# Patient Record
Sex: Female | Born: 1970 | Race: Black or African American | Hispanic: No | Marital: Married | Smoking: Never smoker
Health system: Southern US, Community
[De-identification: ages and names within clinical notes are randomized; demographics above are authoritative.]

## PROBLEM LIST (undated history)

## (undated) ENCOUNTER — Emergency Department (HOSPITAL_COMMUNITY): Admission: EM | Payer: 59 | Source: Home / Self Care

## (undated) DIAGNOSIS — E039 Hypothyroidism, unspecified: Secondary | ICD-10-CM

## (undated) DIAGNOSIS — Z91018 Allergy to other foods: Secondary | ICD-10-CM

## (undated) DIAGNOSIS — T7840XA Allergy, unspecified, initial encounter: Secondary | ICD-10-CM

## (undated) DIAGNOSIS — J309 Allergic rhinitis, unspecified: Secondary | ICD-10-CM

## (undated) DIAGNOSIS — E669 Obesity, unspecified: Secondary | ICD-10-CM

## (undated) DIAGNOSIS — I10 Essential (primary) hypertension: Secondary | ICD-10-CM

## (undated) HISTORY — DX: Allergy, unspecified, initial encounter: T78.40XA

## (undated) HISTORY — DX: Allergic rhinitis, unspecified: J30.9

## (undated) HISTORY — DX: Obesity, unspecified: E66.9

## (undated) HISTORY — PX: TUBAL LIGATION: SHX77

## (undated) HISTORY — DX: Hypothyroidism, unspecified: E03.9

## (undated) HISTORY — DX: Essential (primary) hypertension: I10

---

## 1998-02-15 ENCOUNTER — Inpatient Hospital Stay (HOSPITAL_COMMUNITY): Admission: AD | Admit: 1998-02-15 | Discharge: 1998-02-15 | Payer: Self-pay | Admitting: Obstetrics and Gynecology

## 1998-02-17 ENCOUNTER — Emergency Department (HOSPITAL_COMMUNITY): Admission: EM | Admit: 1998-02-17 | Discharge: 1998-02-17 | Payer: Self-pay | Admitting: Emergency Medicine

## 1998-03-17 ENCOUNTER — Other Ambulatory Visit: Admission: RE | Admit: 1998-03-17 | Discharge: 1998-03-17 | Payer: Self-pay | Admitting: Obstetrics and Gynecology

## 1998-07-03 ENCOUNTER — Emergency Department (HOSPITAL_COMMUNITY): Admission: EM | Admit: 1998-07-03 | Discharge: 1998-07-03 | Payer: Self-pay | Admitting: Emergency Medicine

## 1999-04-06 ENCOUNTER — Other Ambulatory Visit: Admission: RE | Admit: 1999-04-06 | Discharge: 1999-04-06 | Payer: Self-pay | Admitting: *Deleted

## 1999-07-10 ENCOUNTER — Encounter: Admission: RE | Admit: 1999-07-10 | Discharge: 1999-07-10 | Payer: Self-pay | Admitting: Internal Medicine

## 1999-07-10 ENCOUNTER — Encounter: Payer: Self-pay | Admitting: Internal Medicine

## 2000-07-20 ENCOUNTER — Other Ambulatory Visit: Admission: RE | Admit: 2000-07-20 | Discharge: 2000-07-20 | Payer: Self-pay | Admitting: *Deleted

## 2000-09-06 ENCOUNTER — Emergency Department (HOSPITAL_COMMUNITY): Admission: EM | Admit: 2000-09-06 | Discharge: 2000-09-07 | Payer: Self-pay | Admitting: Emergency Medicine

## 2001-04-05 ENCOUNTER — Other Ambulatory Visit: Admission: RE | Admit: 2001-04-05 | Discharge: 2001-04-05 | Payer: Self-pay | Admitting: Obstetrics & Gynecology

## 2001-04-13 ENCOUNTER — Inpatient Hospital Stay (HOSPITAL_COMMUNITY): Admission: AD | Admit: 2001-04-13 | Discharge: 2001-04-13 | Payer: Self-pay | Admitting: Obstetrics and Gynecology

## 2001-09-08 ENCOUNTER — Encounter: Admission: RE | Admit: 2001-09-08 | Discharge: 2001-09-08 | Payer: Self-pay | Admitting: Obstetrics & Gynecology

## 2001-11-11 ENCOUNTER — Inpatient Hospital Stay (HOSPITAL_COMMUNITY): Admission: AD | Admit: 2001-11-11 | Discharge: 2001-11-14 | Payer: Self-pay | Admitting: Obstetrics & Gynecology

## 2001-12-18 ENCOUNTER — Other Ambulatory Visit: Admission: RE | Admit: 2001-12-18 | Discharge: 2001-12-18 | Payer: Self-pay | Admitting: Obstetrics & Gynecology

## 2003-03-05 ENCOUNTER — Other Ambulatory Visit: Admission: RE | Admit: 2003-03-05 | Discharge: 2003-03-05 | Payer: Self-pay | Admitting: Obstetrics & Gynecology

## 2004-01-10 ENCOUNTER — Encounter: Admission: RE | Admit: 2004-01-10 | Discharge: 2004-01-10 | Payer: Self-pay | Admitting: Internal Medicine

## 2005-08-20 ENCOUNTER — Ambulatory Visit (HOSPITAL_COMMUNITY): Admission: RE | Admit: 2005-08-20 | Discharge: 2005-08-20 | Payer: Self-pay | Admitting: Cardiology

## 2007-01-26 ENCOUNTER — Encounter: Admission: RE | Admit: 2007-01-26 | Discharge: 2007-01-26 | Payer: Self-pay | Admitting: Obstetrics & Gynecology

## 2007-04-10 ENCOUNTER — Inpatient Hospital Stay (HOSPITAL_COMMUNITY): Admission: AD | Admit: 2007-04-10 | Discharge: 2007-04-12 | Payer: Self-pay | Admitting: Obstetrics & Gynecology

## 2007-04-10 ENCOUNTER — Encounter (INDEPENDENT_AMBULATORY_CARE_PROVIDER_SITE_OTHER): Payer: Self-pay | Admitting: *Deleted

## 2007-05-09 ENCOUNTER — Ambulatory Visit (HOSPITAL_COMMUNITY): Admission: RE | Admit: 2007-05-09 | Discharge: 2007-05-09 | Payer: Self-pay | Admitting: Obstetrics & Gynecology

## 2008-03-12 ENCOUNTER — Emergency Department (HOSPITAL_COMMUNITY): Admission: EM | Admit: 2008-03-12 | Discharge: 2008-03-12 | Payer: Self-pay | Admitting: Emergency Medicine

## 2008-11-14 ENCOUNTER — Emergency Department (HOSPITAL_COMMUNITY): Admission: EM | Admit: 2008-11-14 | Discharge: 2008-11-14 | Payer: Self-pay | Admitting: Emergency Medicine

## 2010-02-18 ENCOUNTER — Emergency Department (HOSPITAL_COMMUNITY): Admission: EM | Admit: 2010-02-18 | Discharge: 2010-02-18 | Payer: Self-pay | Admitting: Emergency Medicine

## 2010-04-08 ENCOUNTER — Emergency Department (HOSPITAL_COMMUNITY): Admission: EM | Admit: 2010-04-08 | Discharge: 2010-04-08 | Payer: Self-pay | Admitting: Emergency Medicine

## 2010-09-05 ENCOUNTER — Emergency Department (HOSPITAL_COMMUNITY)
Admission: EM | Admit: 2010-09-05 | Discharge: 2010-09-05 | Payer: Self-pay | Source: Home / Self Care | Admitting: Family Medicine

## 2010-11-22 ENCOUNTER — Inpatient Hospital Stay (INDEPENDENT_AMBULATORY_CARE_PROVIDER_SITE_OTHER)
Admit: 2010-11-22 | Discharge: 2010-11-22 | Disposition: A | Discharge status: Home / Self Care | Payer: 59 | Attending: Family Medicine | Admitting: Family Medicine

## 2010-11-22 DIAGNOSIS — T783XXA Angioneurotic edema, initial encounter: Secondary | ICD-10-CM

## 2011-01-12 NOTE — Op Note (Signed)
NAMERONNE, SAVOIA NO.:  [id number]   MEDICAL RECORD NO.:  [id number]          PATIENT TYPE:  AMB   LOCATION:  SDC                           FACILITY:  [facility]   PHYSICIAN:  Genia Del, M.D.DATE OF BIRTH:  [birth year]   DATE OF PROCEDURE:  05/09/2007  DATE OF DISCHARGE:                               OPERATIVE REPORT   PREOPERATIVE DIAGNOSIS:  Desire for bilateral tubal sterilization.   POSTOPERATIVE DIAGNOSIS:  Desire for bilateral tubal sterilization.   PROCEDURE:  Laparoscopy, bilateral tubal sterilization with  cauterization.   SURGEON:  Genia Del, M.D.   ASSISTANT:  None.   ANESTHESIOLOGIST:  Belva Agee, M.D.   DESCRIPTION OF PROCEDURE:  Under general anesthesia with endotracheal  intubation, the patient is in the lithotomy position.  She is prepped  with Betadine on the abdominal, suprapubic, vulvar and vaginal areas and  draped as usual.  The vaginal exam revealed an anteverted uterus, normal  volume, mobile, no adnexal mass.  The speculum is inserted in the  vagina.  The uterus is cannulated. The speculum is removed.  We go  abdominally.  We infiltrate the subcutaneous tissue with Marcaine 0.25%  plain.  We then make a 1.5 cm incision with a scalpel infraumbilically.  We opened the aponeurosis under direct vision with Mayo scissors.  We  then opened the parietal peritoneum under direct vision with Mayo  scissors.  We do a pursestring stitch of 0 Vicryl at the aponeurosis.  We insert the [place] with the camera at that level.  We visualized the  intra-abdominal and pelvic cavities.  No lesion is seen except for an  adhesion between the anterior wall of the abdomen and the omentum on the  midline.  We can go around that to proceed with the tubal.   We used the operative scope. The bipolar clamp is used to cauterize the  right tube at about 2 cm from the cornua. We cauterize the full width of  the tube including the mesosalpinx. We  then cauterize slightly distally  and complete cauterization inbetween the tube.  We proceed exactly the  same way on the left side.  We take pictures before and after  cauterization of both tubes. The tubes were well identified with the  fimbriae at the distal end. Hemostasis is adequate.  We, therefore,  remove all instruments, evacuate the CO2.  We close the infraumbilical  incision by attaching the pursestring stitch on the aponeurosis.  We  then close the skin with a subcuticular stitch of 4-0 Vicryl and put  Dermabond on top. The instrument is removed vaginally.  The estimated  blood loss was minimal.  No complications occurred.  The patient was  brought to the recovery room in good stable status.      Genia Del, M.D.  Electronically Signed    ML/MEDQ  D:  05/09/2007  T:  05/09/2007  Job:  [id number]

## 2011-01-15 NOTE — Op Note (Signed)
[facility]  Patient:    Victoria Sampson, Victoria Sampson Visit Number: [id number] MRN: [id number]          Service Type: OBS Location: 910A 9113 01 Attending Physician:  Maxie Better Dictated by:   Sheria Lang. Cherly Hensen, M.D. Proc. Date: 11/11/01 Admit Date:  11/11/2001                             Operative Report  PREOPERATIVE DIAGNOSES:       1. Fetal intolerance to labor.                               2. Term gestation.                               3. Class A1 gestational diabetes.  POSTOPERATIVE DIAGNOSES:      1. Fetal intolerance to labor.                               2. Term gestation.                               3. Class A1 gestational diabetes.  PROCEDURE:                    Primary cesarean section per hysterotomy.  SURGEON:                      Sheronette A. Cherly Hensen, M.D.  ANESTHESIA:                   Epidural.  INDICATIONS:                  The patient is a 40 year old gravida 3, para 0-0-2-0 female at 39+ weeks gestation with well controlled gestational diabetes who presented with spontaneous rupture of membranes in early labor. on initial presentation, the patient had some deep variable decelerations. Group B strep culture was positive.  She was transferred to labor and delivery, where she had internal scalp electrode placement and an intrauterine pressure catheter.  Amnio infusion was started in order to try to alleviate the variable deceleration that were noted with each contraction.  Her contraction pattern was every 4-6 minutes.  Her cervix progressed to 5-6 cm, 100% and +1 station.  The patient received an epidural and continued to have suboptimal contractile pattern as well as variable decelerations.  The beat-to-beat variability was otherwise good.  Penicillin prophylaxis was given.  Pitocin 1 miu was subsequently started in order to try to facilitate labor.  However, recurrent deep variable decelerations would negate  the ability to continue to use Pitocin, which was necessary for the labor to further progress.  Based on these findings, the patient was informed of the need for a cesarean section.  The risks and benefits of the procedure were explained to the patient and consent was signed.  The patient was transferred to the operating room.  DESCRIPTION OF PROCEDURE:     Under adequate epidural anesthesia, the patient was placed in the supine position with a left lateral tilt.  She had an indwelling Foley catheter in place.  She was sterilely prepped and draped in the usual fashion.  Approximately 8 cc of 0.25% Marcaine was injected along the planned Pfannenstiel skin incision.  A Pfannenstiel skin incision was then made and carried down sharply to the rectus fascia.  The rectus fascia was incised in the midline and extended bilaterally.  The rectus fascia was then bluntly and sharply dissected off the rectus muscles in superior and inferior fashion.  The rectus muscle was split in the midline.  The parietal peritoneum was entered bluntly and extended superiorly and inferiorly.  The vesicouterine peritoneum was then opened.  A curvilinear low transverse uterine incision was then made and extended bluntly with bandage scissors.  A loop of cord was noted.  The baby was delivered from the left occiput transverse presentation. The cord was taken from around the neck.  In attempting to deliver the shoulder, subsequent need for a 180 rotation of the baby was done in order to facilitate delivery of the shoulder, with subsequent delivery of a live female. The cord was clamped and cut.  The baby was subsequently transferred to the awaiting pediatricians, who assigned Apgars of 8 and 9 at one and five minutes.  The weight of the baby was 7 lb even.  Cord pH was obtained, which was 7.21.  Cord bloods were obtained.  The placenta was spontaneous and intact.  The uterine cavity was then cleaned of debris.  No  extension was noted on the uterine incision.  The uterine incision was closed in two layers. The first layer was a 0 Monocryl running lock stitch.  The second layer was imbricating with 0 Monocryl.  A small bleeder in the midportion of the incision resulted in a figure-of-eight placement with good hemostasis subsequently noted.  Normal tubes and ovaries were noted bilaterally.  The abdomen was irrigated and suctioned of debris.   The pericolic gutters were cleaned of debris.  Reinspection of the incision showed some bleeding along the peritoneal edge of the bladder, which was then cauterized with good hemostasis.  The vesicouterine peritoneum was not closed, nor was the parietal peritoneum.  The surface of the rectus fascia was inspected and all bleeders cauterized.  The rectus fascia was closed with 0 Vicryl x 2.  The subcutaneous area was irrigated, small bleeders cauterized.  The skin was approximated using Ethicon staples.  Specimens of placenta were not sent to pathology. Estimated blood loss was 500 cc.  Intraoperative fluid was 1700 cc crystalloid.  Urine output was 275 cc of clear yellow urine.  Sponge and instrument counts x2 were correct.  There were no complications.  The patient tolerated the procedure well and was transferred to the recovery room in stable condition. Dictated by:   Sheria Lang. Cherly Hensen, M.D. Attending Physician:  Maxie Better DD:  11/11/01 TD:  11/13/01 Job: [id number]

## 2011-01-15 NOTE — Discharge Summary (Signed)
[facility]  Patient:    Victoria Sampson, Victoria Sampson Visit Number: [id number] MRN: [id number]          Service Type: OBS Location: 910A 9113 01 Attending Physician:  Maxie Better Dictated by:   Sheria Lang. Cherly Hensen, M.D. Admit Date:  11/11/2001 Discharge Date: 11/14/2001                             Discharge Summary  ADMISSION DIAGNOSES:          1. Spontaneous rupture of membranes, term                                  gestation.                               2. Class A1 gestational diabetes.  DISCHARGE DIAGNOSES:          1. Term gestation delivered.                               2. Class A1 gestational diabetes.                               3. Fetal intolerance to labor status post                                  primary cesarean section.                               4. Postoperative anemia.  PROCEDURES:                   Primary cesarean section.  HISTORY OF PRESENT ILLNESS:   A 40 year old gravida 3, para 0 female at 39-2/7 weeks with class A1 gestational diabetes presented with spontaneous rupture of membranes.  On presentation, the patient was 3 to 4 cm, 100%, -1, fern positive.  HOSPITAL COURSE:              The patient had a reactive tracing with some variable deceleration in maternity admissions.  She was contracting every 4 to 5 minutes.  Her group B strep culture was positive.  The patient was started on penicillin prophylaxis.  Internal scalp electrode and intrauterine pressure catheter were placed.  The patient subsequently had epidural, and the incision was done for the variable decelerations.  The variable decelerations persistent.  The patient was noted to have suboptimal contractions, frequency, and strength that would necessitate Pitocin augmentation, although given the persistent variable decelerations, she was not a candidate for Pitocin. Maternal oxygenation was given.  The patient on maternal lateral side. Tracing was  observed with improvement.  Pitocin was started in 1 milliunits. This was discontinued secondary to continued deceleration.  At that time, the patient was 7 cm, 100%, +1, right occiput transverse position, contracting about every 2 to 3 minutes, good beat-to-beat variability.  Subcutaneous terbutaline x 1 was given.  Given the fetal intolerance to labor, decision was made to proceed with a primary cesarean section.  The patient was taken to the operating room.  Findings were a baby  who was alive, female, Apgars 8/9, cord pH 7.21, cord around neck x 1.  Normal tubes and ovaries were identified.  Postoperatively, the patient had a CBC showing hemoglobin 10.8, hematocrit 32.5, white count 10.9.  She was continued on antibiotics until afebrile for 24 hours.  Her postoperative course was notable for slightly increased blood pressure; however, no suggestion of superimposed preeclampsia.  The patient has a history of hypertension without any medications.  By postop day #3, the patient was voiding well, tolerating a regular diet, without any headache, visual changes, or right upper quadrant pain.  Her incision was without any erythema, induration, or exudate.  She had 1+ pitting edema.  She was deemed well to be discharged home.  DISPOSITION:                  Home.  CONDITION UPON DISCHARGE:     Stable.  DISCHARGE MEDICATIONS:        1. Tylox 1 p.o. q.6-8h. p.r.n. pain, #20.                               2. Motrin 600 mg 1 p.o. q.6h. p.r.n. pain.                               3. Prenatal vitamins 1 p.o. q.d.                               4. Ferrous sulfate 1 p.o. q.d.  DISCHARGE INSTRUCTIONS:       PIH instructions were given.  Call for temperature greater or equal to 100.4.  Nothing per vagina for four to six weeks.  No heavy lifting or driving for two weeks.  Call if increased incisional pain, redness, or drainage at incision site, soaking a regular pad every hour or more frequently,  severe abdominal pain, nausea, vomiting.  FOLLOWUP:                     [facility] in four to six weeks. ictated by:   Sheronette A. Cherly Hensen, M.D. Attending Physician:  Maxie Better DD:  11/23/01 TD:  11/24/01 Job: [id number]

## 2011-05-03 ENCOUNTER — Emergency Department (HOSPITAL_COMMUNITY)
Admission: EM | Admit: 2011-05-03 | Discharge: 2011-05-03 | Disposition: A | Discharge status: Home / Self Care | Payer: 59 | Attending: Emergency Medicine | Admitting: Emergency Medicine

## 2011-05-03 DIAGNOSIS — R110 Nausea: Secondary | ICD-10-CM | POA: Insufficient documentation

## 2011-05-03 DIAGNOSIS — X58XXXA Exposure to other specified factors, initial encounter: Secondary | ICD-10-CM | POA: Insufficient documentation

## 2011-05-03 DIAGNOSIS — T783XXA Angioneurotic edema, initial encounter: Secondary | ICD-10-CM | POA: Insufficient documentation

## 2011-05-03 DIAGNOSIS — I10 Essential (primary) hypertension: Secondary | ICD-10-CM | POA: Insufficient documentation

## 2011-05-03 DIAGNOSIS — Z9109 Other allergy status, other than to drugs and biological substances: Secondary | ICD-10-CM | POA: Insufficient documentation

## 2011-05-03 DIAGNOSIS — M25579 Pain in unspecified ankle and joints of unspecified foot: Secondary | ICD-10-CM | POA: Insufficient documentation

## 2011-05-03 DIAGNOSIS — E039 Hypothyroidism, unspecified: Secondary | ICD-10-CM | POA: Insufficient documentation

## 2011-05-27 LAB — D-DIMER, QUANTITATIVE: D-Dimer, Quant: <0.22

## 2011-05-27 LAB — POCT I-STAT, CHEM 8
BUN: 8
Creatinine, Ser: 0.9
Hemoglobin: 15
Potassium: 3.4 — ABNORMAL LOW
Sodium: 139

## 2011-05-27 LAB — POCT CARDIAC MARKERS
CKMB, poc: 1.3
Myoglobin, poc: 54.1
Operator id: 231701

## 2011-06-11 LAB — CBC
MCHC: 33.8
Platelets: 248
RBC: 4.69
WBC: 4.6

## 2011-06-11 LAB — BASIC METABOLIC PANEL
Chloride: 101
Creatinine, Ser: 0.67
GFR calc Af Amer: >60
GFR calc non Af Amer: >60
Potassium: 3.2 — ABNORMAL LOW

## 2011-06-11 LAB — PREGNANCY, URINE: Preg Test, Ur: NEGATIVE

## 2011-06-14 LAB — CBC
HCT: 32.1 — ABNORMAL LOW
HCT: 43.3
Hemoglobin: 11 — ABNORMAL LOW
MCHC: 34.2
MCV: 87.7
MCV: 87.9
Platelets: 168
RBC: 4.94
RDW: 14.6 — ABNORMAL HIGH
WBC: 7.3

## 2011-06-14 LAB — URIC ACID: Uric Acid, Serum: 4.7

## 2011-06-14 LAB — COMPREHENSIVE METABOLIC PANEL
ALT: 15
AST: 31
Albumin: 3 — ABNORMAL LOW
Alkaline Phosphatase: 152 — ABNORMAL HIGH
BUN: 6
Chloride: 103
Potassium: 4.6
Sodium: 133 — ABNORMAL LOW
Total Bilirubin: 0.9

## 2011-06-14 LAB — RPR: RPR Ser Ql: NONREACTIVE

## 2011-10-15 ENCOUNTER — Other Ambulatory Visit: Payer: Self-pay | Admitting: Internal Medicine

## 2011-10-15 DIAGNOSIS — R748 Abnormal levels of other serum enzymes: Secondary | ICD-10-CM

## 2011-10-15 DIAGNOSIS — R1012 Left upper quadrant pain: Secondary | ICD-10-CM

## 2011-10-21 ENCOUNTER — Ambulatory Visit
Admission: RE | Admit: 2011-10-21 | Discharge: 2011-10-21 | Disposition: A | Discharge status: Home / Self Care | Payer: 59 | Source: Ambulatory Visit | Attending: Internal Medicine | Admitting: Internal Medicine

## 2011-10-21 ENCOUNTER — Other Ambulatory Visit: Payer: Self-pay | Admitting: Internal Medicine

## 2011-10-21 DIAGNOSIS — M545 Low back pain: Secondary | ICD-10-CM

## 2011-10-21 DIAGNOSIS — R1012 Left upper quadrant pain: Secondary | ICD-10-CM

## 2011-10-21 DIAGNOSIS — R748 Abnormal levels of other serum enzymes: Secondary | ICD-10-CM

## 2011-12-16 ENCOUNTER — Other Ambulatory Visit: Payer: Self-pay | Admitting: Obstetrics & Gynecology

## 2011-12-16 DIAGNOSIS — Z1231 Encounter for screening mammogram for malignant neoplasm of breast: Secondary | ICD-10-CM

## 2011-12-27 ENCOUNTER — Ambulatory Visit
Admission: RE | Admit: 2011-12-27 | Discharge: 2011-12-27 | Disposition: A | Discharge status: Home / Self Care | Payer: 59 | Source: Ambulatory Visit | Attending: Obstetrics & Gynecology | Admitting: Obstetrics & Gynecology

## 2011-12-27 DIAGNOSIS — Z1231 Encounter for screening mammogram for malignant neoplasm of breast: Secondary | ICD-10-CM

## 2012-05-02 ENCOUNTER — Other Ambulatory Visit: Payer: Self-pay | Admitting: Internal Medicine

## 2012-05-02 DIAGNOSIS — R1012 Left upper quadrant pain: Secondary | ICD-10-CM

## 2012-05-05 ENCOUNTER — Ambulatory Visit
Admission: RE | Admit: 2012-05-05 | Discharge: 2012-05-05 | Disposition: A | Discharge status: Home / Self Care | Payer: 59 | Source: Ambulatory Visit | Attending: Internal Medicine | Admitting: Internal Medicine

## 2012-05-05 DIAGNOSIS — R1012 Left upper quadrant pain: Secondary | ICD-10-CM

## 2012-05-05 MED ORDER — IOHEXOL 300 MG/ML  SOLN: 125.0000 mL | Freq: Once | INTRAMUSCULAR | Status: AC | PRN

## 2012-09-19 ENCOUNTER — Encounter: Payer: Self-pay | Admitting: Hematology

## 2012-12-20 ENCOUNTER — Emergency Department (HOSPITAL_COMMUNITY)
Admission: EM | Admit: 2012-12-20 | Discharge: 2012-12-20 | Disposition: A | Discharge status: Home / Self Care | Payer: 59 | Source: Home / Self Care | Attending: Family Medicine | Admitting: Family Medicine

## 2012-12-20 ENCOUNTER — Encounter (HOSPITAL_COMMUNITY): Payer: Self-pay | Admitting: Emergency Medicine

## 2012-12-20 DIAGNOSIS — T783XXA Angioneurotic edema, initial encounter: Secondary | ICD-10-CM

## 2012-12-20 HISTORY — DX: Allergy to other foods: Z91.018

## 2012-12-20 MED ORDER — PREDNISONE 20 MG PO TABS: ORAL_TABLET | ORAL | Status: DC

## 2012-12-20 MED ORDER — METHYLPREDNISOLONE SODIUM SUCC 125 MG IJ SOLR
INTRAMUSCULAR | Status: AC
  Filled 2012-12-20: qty 2

## 2012-12-20 MED ORDER — METHYLPREDNISOLONE SODIUM SUCC 125 MG IJ SOLR
125.0000 mg | Freq: Once | INTRAMUSCULAR | Status: AC
  Administered 2012-12-20: 125 mg via INTRAMUSCULAR

## 2012-12-20 NOTE — ED Notes (Addendum)
Pt c/o allergic reaction possibly to chocolate she had yesterday. 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 Right side of face is swollen and inside mouth. Is itching and a little painful. Has a slight headache. No fever or SOB. Took Zyrtec and Famotidine this morning with mild relief. Patient is alert and oriented.

## 2012-12-20 NOTE — ED Provider Notes (Signed)
History     CSN: [id number]  Arrival date & time 12/20/12  1019   First MD Initiated Contact with Patient 12/20/12 1023      Chief Complaint  Patient presents with  . Allergic Reaction    (Consider location/radiation/quality/duration/timing/severity/associated sxs/prior treatment) HPI Comments: 42 year old female with history of  Allergy to nuts here complaining of right-sided upper lip swelling that she first noticed at wake up this morning. Patient states that she ate chocolate last night that may have contained nuts. She denies throat discomfort or difficulty breathing. No wheezing. No shortness of breath. Denies nausea or vomiting. No abdominal pain. Denies headache or dizziness. No rash. No fever or chills. No other known exposures. No taking ACE inhibitors.    Past Medical History  Diagnosis Date  . Hypertension   . Hypothyroidism   . Obesity   . Allergic rhinitis   . Food allergy     Past Surgical History  Procedure Laterality Date  . Tubal ligation      History reviewed. No pertinent family history.  History  Substance Use Topics  . Smoking status: Never Smoker   . Smokeless tobacco: Not on file  . Alcohol Use: No    OB History   Grav Para Term Preterm Abortions TAB SAB Ect Mult Living                  Review of Systems  Constitutional: Negative for fever and chills.  HENT: Negative for sore throat, trouble swallowing and voice change.        As per HPI  Respiratory: Negative for cough, chest tightness, shortness of breath and wheezing.   Cardiovascular: Negative for chest pain.  Gastrointestinal: Negative for nausea, vomiting, abdominal pain and diarrhea.  Genitourinary: Negative for dysuria and genital sores.  Skin: Positive for rash.  Neurological: Negative for dizziness and headaches.  All other systems reviewed and are negative.    Allergies  Asa; Macrobid; and Tylenol  Home Medications   Current Outpatient Rx  Name  Route  Sig  Dispense   Refill  . amLODipine (NORVASC) 5 MG tablet   Oral   Take 5 mg by mouth daily.         Marland Kitchen levocetirizine (XYZAL) 5 MG tablet   Oral   Take 5 mg by mouth every evening.         . predniSONE (DELTASONE) 20 MG tablet      2 tabs by mouth daily for 5 days.   10 tablet   0     BP 144/90  Pulse 71  Temp(Src) 98.4 F (36.9 C) (Oral)  Resp 18  SpO2 100%  LMP 12/14/2012  Physical Exam  Nursing note and vitals reviewed. Constitutional: She is oriented to person, place, and time. She appears well-developed and well-nourished. No distress.  HENT:  Head: Normocephalic and atraumatic.  Right Ear: External ear normal.  Left Ear: External ear normal.  Mouth/Throat: Oropharynx is clear and moist. No oropharyngeal exudate.  There is a uniformly firm edema of the right side upper lip. No erythema associated. No skin brakes or areas of induration or fluctuation.   Eyes: Conjunctivae and EOM are normal. Pupils are equal, round, and reactive to light. Right eye exhibits no discharge. Left eye exhibits no discharge. No scleral icterus.  Neck: Neck supple. No thyromegaly present.  Cardiovascular: Normal heart sounds.   Pulmonary/Chest: Breath sounds normal.  Abdominal: Soft. There is no tenderness.  Lymphadenopathy:    She has no  cervical adenopathy.  Neurological: She is alert and oriented to person, place, and time.  Skin: She is not diaphoretic.    ED Course  Procedures (including critical care time)  Labs Reviewed - No data to display No results found.   1. Angioedema of lips, initial encounter       MDM  Impress mild angioedema of the upper lip related to food allergies. Patient had administered Solu-Medrol 125 mg IM x1. Prescribed prednisone , continue levocetirizine and famotidine. Recommended to avoid re\re exposures. Patient denies anaphylaxis symptoms in the past, although she does have EpiPen at home. Supportive care and red flags that should prompt her return to  medical attention discussed with patient and provided in writing.        Sharin Grave, MD 12/22/12 [phone]

## 2013-02-07 ENCOUNTER — Other Ambulatory Visit: Payer: Self-pay

## 2013-02-07 DIAGNOSIS — Z1231 Encounter for screening mammogram for malignant neoplasm of breast: Secondary | ICD-10-CM

## 2013-03-07 ENCOUNTER — Ambulatory Visit
Admission: RE | Admit: 2013-03-07 | Discharge: 2013-03-07 | Disposition: A | Discharge status: Home / Self Care | Payer: 59 | Source: Ambulatory Visit

## 2013-03-07 DIAGNOSIS — Z1231 Encounter for screening mammogram for malignant neoplasm of breast: Secondary | ICD-10-CM

## 2013-04-22 ENCOUNTER — Encounter (HOSPITAL_COMMUNITY): Payer: Self-pay | Admitting: Emergency Medicine

## 2013-04-22 ENCOUNTER — Emergency Department (HOSPITAL_COMMUNITY)
Admission: EM | Admit: 2013-04-22 | Discharge: 2013-04-22 | Disposition: A | Discharge status: Home / Self Care | Payer: 59 | Source: Home / Self Care | Attending: Emergency Medicine | Admitting: Emergency Medicine

## 2013-04-22 DIAGNOSIS — T783XXA Angioneurotic edema, initial encounter: Secondary | ICD-10-CM

## 2013-04-22 MED ORDER — PREDNISONE 20 MG PO TABS
ORAL_TABLET | ORAL | Status: AC
  Filled 2013-04-22: qty 3

## 2013-04-22 MED ORDER — PREDNISONE 20 MG PO TABS
60.0000 mg | ORAL_TABLET | Freq: Every day | ORAL | Status: DC
  Administered 2013-04-22: 60 mg via ORAL

## 2013-04-22 MED ORDER — PREDNISONE 10 MG PO TABS: ORAL_TABLET | ORAL | Status: DC

## 2013-04-22 NOTE — ED Provider Notes (Signed)
Medical screening examination/treatment/procedure(s) were performed by non-physician practitioner and as supervising physician I was immediately available for consultation/collaboration.  Leslee Home, M.D.  Reuben Likes, MD 04/22/13 [phone]

## 2013-04-22 NOTE — ED Provider Notes (Signed)
  CSN: [id number]     Arrival date & time 04/22/13  1108 History     First MD Initiated Contact with Patient 04/22/13 1301     Chief Complaint  Patient presents with  . Oral Swelling   (Consider location/radiation/quality/duration/timing/severity/associated sxs/prior Treatment) Patient is a 42 y.o. female presenting with allergic reaction. The history is provided by the patient. No language interpreter was used.  Allergic Reaction Presenting symptoms: swelling   Presenting symptoms: no difficulty breathing   Severity:  Moderate Relieved by:  Nothing Worsened by:  Nothing tried Pt complains of swollen lips.   Pt reports her upper lip began swelling yesterday,  Some decrease with zyrtec and pepcid.  Pt reports today lower lip feels swollen.  Pt has had in the past.  Pt sees Dr. Tustin Callas.   Pt has multiple allergies  Past Medical History  Diagnosis Date  . Hypertension   . Hypothyroidism   . Obesity   . Allergic rhinitis   . Food allergy    Past Surgical History  Procedure Laterality Date  . Tubal ligation    . Cesarean section     No family history on file. History  Substance Use Topics  . Smoking status: Never Smoker   . Smokeless tobacco: Not on file  . Alcohol Use: No   OB History   Grav Para Term Preterm Abortions TAB SAB Ect Mult Living                 Review of Systems  HENT: Positive for facial swelling.   All other systems reviewed and are negative.    Allergies  Asa; Macrobid; and Tylenol  Home Medications   Current Outpatient Rx  Name  Route  Sig  Dispense  Refill  . famotidine (PEPCID) 10 MG tablet   Oral   Take 10 mg by mouth 2 (two) times daily.         Marland Kitchen amLODipine (NORVASC) 5 MG tablet   Oral   Take 5 mg by mouth daily.         Marland Kitchen levocetirizine (XYZAL) 5 MG tablet   Oral   Take 5 mg by mouth every evening.         . predniSONE (DELTASONE) 20 MG tablet      2 tabs by mouth daily for 5 days.   10 tablet   0    BP 155/81  Pulse  58  Temp(Src) 98.5 F (36.9 C) (Oral)  Resp 16  SpO2 100% Physical Exam  Nursing note and vitals reviewed. Constitutional: She appears well-developed and well-nourished.  HENT:  Head: Normocephalic and atraumatic.  Right Ear: External ear normal.  Left Ear: External ear normal.  Nose: Nose normal.  Mouth/Throat: Oropharynx is clear and moist.  Swollen upper lip,  No tongue or throat swelling  Eyes: Conjunctivae and EOM are normal. Pupils are equal, round, and reactive to light.  Neck: Normal range of motion.  Cardiovascular: Normal rate.   Pulmonary/Chest: Effort normal.  Abdominal: Soft.  Musculoskeletal: Normal range of motion.  Neurological: She is alert.  Skin: Skin is warm.    ED Course   Procedures (including critical care time)  Labs Reviewed - No data to display No results found. 1. Angioedema of lips, initial encounter     MDM  Prednisone taper, continue zyrtec and pepcid  Elson Areas, PA-C 04/22/13 1343

## 2013-04-22 NOTE — ED Notes (Signed)
Patient has many food allergies.  Unsure what she came in contact with yesterday, but tongue swelled, took pepcid.  This morning tongue has improved, but right cheek and upper lip are swollen, has slight nausea.  Denies difficulty breathing.  Instructed patient to let staff know if any trouble breathing or anything worsens.

## 2013-04-22 NOTE — ED Notes (Signed)
Patient seen earlier today in lobby.  Since initial meting, patient says upper lip is receding and she thinks lower lip is swelling.  No tongue swelling, denies difficulty swallowing or breathing.

## 2013-12-18 ENCOUNTER — Encounter (HOSPITAL_COMMUNITY): Payer: Self-pay | Admitting: Emergency Medicine

## 2013-12-18 ENCOUNTER — Emergency Department (INDEPENDENT_AMBULATORY_CARE_PROVIDER_SITE_OTHER)
Admission: EM | Admit: 2013-12-18 | Discharge: 2013-12-18 | Disposition: A | Discharge status: Home / Self Care | Payer: 59 | Source: Home / Self Care | Attending: Emergency Medicine | Admitting: Emergency Medicine

## 2013-12-18 DIAGNOSIS — N3000 Acute cystitis without hematuria: Secondary | ICD-10-CM

## 2013-12-18 LAB — POCT URINALYSIS DIP (DEVICE)
Bilirubin Urine: NEGATIVE
Glucose, UA: NEGATIVE mg/dL
Ketones, ur: NEGATIVE mg/dL
Nitrite: NEGATIVE
Protein, ur: 100 mg/dL — AB
Specific Gravity, Urine: 1.015 (ref 1.005–1.030)
Urobilinogen, UA: 0.2 mg/dL (ref 0.0–1.0)
pH: 7.5 (ref 5.0–8.0)

## 2013-12-18 MED ORDER — PHENAZOPYRIDINE HCL 200 MG PO TABS: 200.0000 mg | ORAL_TABLET | Freq: Three times a day (TID) | ORAL | Status: DC | PRN

## 2013-12-18 MED ORDER — CEPHALEXIN 500 MG PO CAPS: 500.0000 mg | ORAL_CAPSULE | Freq: Three times a day (TID) | ORAL | Status: DC

## 2013-12-18 NOTE — ED Notes (Signed)
MD evaluation only 

## 2013-12-18 NOTE — ED Provider Notes (Signed)
Chief Complaint   No chief complaint on file.   History of Present Illness   Victoria Sampson is a 43 year old female who has had a history since this morning dysuria, frequency, urgency, and bloody urine. She also has had lower back pain and some nausea. She denies any fever, chills, vomiting, abdominal pain, or GYN complaints. She did have urinary tract infection but it was over a year ago.  Review of Systems   Other than as noted above, the patient denies any of the following symptoms: General:  No fevers or chills. GI:  No abdominal pain, back pain, nausea, or vomiting. GU:  No hematuria or incontinence. GYN:  No discharge, itching, vulvar pain or lesions, pelvic pain, or abnormal vaginal bleeding.  PMFSH   Past medical history, family history, social history, meds, and allergies were reviewed.  She is allergic to Macrobid. She has high blood pressure and allergies. She takes amlodipine, spironolactone, Xyzal.  P and is ahysical Examination     Vital signs:  BP 143/94  Pulse 78  Temp(Src) 98.3 F (36.8 C) (Oral)  Resp 16  SpO2 100% Gen:  Alert, oriented, in no distress. Lungs:  Clear to auscultation, no wheezes, rales or rhonchi. Heart:  Regular rhythm, no gallop or murmer. Abdomen:  Flat and soft. There was slight suprapubic pain to palpation.  No guarding, or rebound.  No hepato-splenomegaly or mass.  Bowel sounds were normally active.  No hernia. Back:  No CVA tenderness.  Skin:  Clear, warm and dry. In your: KOH LE  Labs   Results for orders placed during the hospital encounter of 12/18/13  POCT URINALYSIS DIP (DEVICE)      Result Value Ref Range   Glucose, UA NEGATIVE  NEGATIVE mg/dL   Bilirubin Urine NEGATIVE  NEGATIVE   Ketones, ur NEGATIVE  NEGATIVE mg/dL   Specific Gravity, Urine 1.015  1.005 - 1.030   Hgb urine dipstick LARGE (*) NEGATIVE   pH 7.5  5.0 - 8.0   Protein, ur 100 (*) NEGATIVE mg/dL   Urobilinogen, UA 0.2  0.0 - 1.0 mg/dL   Nitrite NEGATIVE   NEGATIVE   Leukocytes, UA SMALL (*) NEGATIVE     A urine culture was obtained.  Results are pending at this time and we will call about any positive results.  Assessment   The encounter diagnosis was Acute cystitis.   No evidence of pyelonephritis.    Plan   1.  Meds:  The following meds were prescribed:   New Prescriptions   CEPHALEXIN (KEFLEX) 500 MG CAPSULE    Take 1 capsule (500 mg total) by mouth 3 (three) times daily.   PHENAZOPYRIDINE (PYRIDIUM) 200 MG TABLET    Take 1 tablet (200 mg total) by mouth 3 (three) times daily as needed for pain.    2.  Patient Education/Counseling:  The patient was given appropriate handouts, self care instructions, and instructed in symptomatic relief. The patient was told to avoid intercourse for 10 days, get extra fluids, and return for a follow up with her primary care doctor at the completion of treatment for a repeat UA and culture.    3.  Follow up:  The patient was told to follow up here if no better in 3 to 4 days, or sooner if becoming worse in any way, and given some red flag symptoms such as fever, persistent vomiting, or severe flank or abdominal pain which would prompt immediate return.     Reuben Likesavid C Samariya Rockhold, MD  12/18/13 2221 

## 2013-12-18 NOTE — Discharge Instructions (Signed)
Urinary Tract Infection  Urinary tract infections (UTIs) can develop anywhere along your urinary tract. Your urinary tract is your body's drainage system for removing wastes and extra water. Your urinary tract includes two kidneys, two ureters, a bladder, and a urethra. Your kidneys are a pair of bean-shaped organs. Each kidney is about the size of your fist. They are located below your ribs, one on each side of your spine.  CAUSES  Infections are caused by microbes, which are microscopic organisms, including fungi, viruses, and bacteria. These organisms are so small that they can only be seen through a microscope. Bacteria are the microbes that most commonly cause UTIs.  SYMPTOMS   Symptoms of UTIs may vary by age and gender of the patient and by the location of the infection. Symptoms in young women typically include a frequent and intense urge to urinate and a painful, burning feeling in the bladder or urethra during urination. Older women and men are more likely to be tired, shaky, and weak and have muscle aches and abdominal pain. A fever may mean the infection is in your kidneys. Other symptoms of a kidney infection include pain in your back or sides below the ribs, nausea, and vomiting.  DIAGNOSIS  To diagnose a UTI, your caregiver will ask you about your symptoms. Your caregiver also will ask to provide a urine sample. The urine sample will be tested for bacteria and white blood cells. White blood cells are made by your body to help fight infection.  TREATMENT   Typically, UTIs can be treated with medication. Because most UTIs are caused by a bacterial infection, they usually can be treated with the use of antibiotics. The choice of antibiotic and length of treatment depend on your symptoms and the type of bacteria causing your infection.  HOME CARE INSTRUCTIONS   If you were prescribed antibiotics, take them exactly as your caregiver instructs you. Finish the medication even if you feel better after you  have only taken some of the medication.   Drink enough water and fluids to keep your urine clear or pale yellow.   Avoid caffeine, tea, and carbonated beverages. They tend to irritate your bladder.   Empty your bladder often. Avoid holding urine for long periods of time.   Empty your bladder before and after sexual intercourse.   After a bowel movement, women should cleanse from front to back. Use each tissue only once.  SEEK MEDICAL CARE IF:    You have back pain.   You develop a fever.   Your symptoms do not begin to resolve within 3 days.  SEEK IMMEDIATE MEDICAL CARE IF:    You have severe back pain or lower abdominal pain.   You develop chills.   You have nausea or vomiting.   You have continued burning or discomfort with urination.  MAKE SURE YOU:    Understand these instructions.   Will watch your condition.   Will get help right away if you are not doing well or get worse.  Document Released: 05/26/2005 Document Revised: 02/15/2012 Document Reviewed: 09/24/2011  Exit[employer] Patient Information 2014 [employer].

## 2013-12-20 LAB — URINE CULTURE: SPECIAL REQUESTS: NORMAL

## 2013-12-20 NOTE — ED Notes (Signed)
Urine culture: >100,000 colonies E. Coli.  Pt. adequately treated with Keflex. [facility]

## 2013-12-21 NOTE — Progress Notes (Signed)
Quick Note:  Results are abnormal as noted, but have been adequately treated. No further action necessary. ______ 

## 2013-12-24 ENCOUNTER — Ambulatory Visit (INDEPENDENT_AMBULATORY_CARE_PROVIDER_SITE_OTHER): Payer: 59

## 2013-12-24 ENCOUNTER — Ambulatory Visit (INDEPENDENT_AMBULATORY_CARE_PROVIDER_SITE_OTHER): Payer: 59 | Admitting: Podiatry

## 2013-12-24 ENCOUNTER — Encounter: Payer: Self-pay | Admitting: Podiatry

## 2013-12-24 VITALS — BP 148/96 | HR 74 | Resp 17 | Ht 66.0 in | Wt 238.0 lb

## 2013-12-24 DIAGNOSIS — M79609 Pain in unspecified limb: Secondary | ICD-10-CM

## 2013-12-24 DIAGNOSIS — M722 Plantar fascial fibromatosis: Secondary | ICD-10-CM

## 2013-12-24 MED ORDER — TRIAMCINOLONE ACETONIDE 10 MG/ML IJ SUSP
10.0000 mg | Freq: Once | INTRAMUSCULAR | Status: AC
  Administered 2013-12-24: 10 mg

## 2013-12-24 NOTE — Patient Instructions (Signed)

## 2013-12-24 NOTE — Progress Notes (Signed)
   Subjective:    Patient ID: Victoria Sampson T Kunst, female    DOB: February 13, 1971, 43 y.o.   MRN: [id number]  HPI Comments: N heel pain L B/L heel and inferior arch D 4 month O C pulling, continuous pain A worse after rest T changing shoes, TFC orthotics, and OTC orthotic     Review of Systems  Genitourinary: Positive for dyspareunia.       Currently being treated for UTI  All other systems reviewed and are negative.      Objective:   Physical Exam  Orientated x3 female  Vascular: DP and PT pulses 2/4 bilaterally  Neurological: Knee and ankle flex equal and reactive bilaterally  Dermatological: Texture and turgor within normal limits  Musculoskeletal: Palpable tenderness medial plantar and proximal one thirds fascial bands bilaterally. These are the symptomatic areas  X-ray examination left foot  Intact bony structure without fracture or dislocation noted. Inferior calcaneal spur noted. Dorsal exostosis had her first metatarsal noted. Pes planus noted.  Radiographic impression: No acute bony abnormality noted left foot     X-ray examination right foot  Intact bony structure without fracture or dislocation noted. Posterior and inferior calcaneal spurs noted. Pes planus noted.  Radiographic impression: No acute bony abnormality noted        Assessment & Plan:   Assessment: Bilateral fasciitis  Plan: The skin is prepped with alcohol and Betadine and 10 mg of Kenalog mixed with 10 mg of plain Xylocaine and 2.5 mg of plain Marcaine are injected into each right and left heels for Kenalog injection #1  Digital scan obtained today for custom foot orthotic.  Notify patient on receipt of orthotic

## 2013-12-25 ENCOUNTER — Encounter: Payer: Self-pay | Admitting: Podiatry

## 2014-01-15 ENCOUNTER — Telehealth: Payer: Self-pay | Admitting: *Deleted

## 2014-01-15 NOTE — Telephone Encounter (Signed)
Have my shoe insoles come in yet?  I called and left her a message with a gentleman that they are here.  She needs to call and schedule an appointment to pick them up.

## 2014-01-30 ENCOUNTER — Encounter: Payer: Self-pay | Admitting: Podiatry

## 2014-01-30 ENCOUNTER — Ambulatory Visit (INDEPENDENT_AMBULATORY_CARE_PROVIDER_SITE_OTHER): Payer: 59 | Admitting: Podiatry

## 2014-01-30 VITALS — BP 132/60 | HR 60 | Resp 12

## 2014-01-30 DIAGNOSIS — M722 Plantar fascial fibromatosis: Secondary | ICD-10-CM

## 2014-01-30 NOTE — Patient Instructions (Addendum)
WEARING INSTRUCTIONS FOR ORTHOTICS  Don't expect to be comfortable wearing your orthotic devices for the first time.  Like eyeglasses, you may be aware of them as time passes, they will not be uncomfortable and you will enjoy wearing them.  FOLLOW THESE INSTRUCTIONS EXACTLY!  1. Wear your orthotic devices for:       Not more than 1 hour the first day.       Not more than 2 hours the second day.       Not more than 3 hours the third day and so on.        Or wear them for as long as they feel comfortable.       If you experience discomfort in your feet or legs take them out.  When feet & legs feel       better, put them back in.  You do need to be consistent and wear them a little        everyday. 2.   If at any time the orthotic devices become acutely uncomfortable before the       time for that particular day, STOP WEARING THEM. 3.   On the next day, do not increase the wearing time. 4.   Subsequently, increase the wearing time by 15-30 minutes only if comfortable to do       so. 5.   You will be seen by your doctor about 2-4 weeks after you receive your orthotic       devices, at which time you will probably be wearing your devices comfortably        for about 8 hours or more a day. 6.   Some patients occasionally report mild aches or discomfort in other parts of the of       body such as the knees, hips or back after 3 or 4 consecutive hours of wear.  If this       is the case with you, do not extend your wearing time.  Instead, cut it back an hour or       two.  In all likelihood, these symptoms will disappear in a short period of time as your       body posture realigns itself and functions more efficiently. 7.   It is possible that your orthotic device may require some small changes or adjustment       to improve their function or make them more comfortable.   This is usually not done       before one to three months have elapsed.  These adjustments are made in        accordance  with the changed position your feet are assuming as a result of       improved biomechanical function. 8.   In women's shoes, it's not unusual for your heel to slip out of the shoe, particularly if       they are step-in-shoes.  If this is the case, try other shoes or other styles.  Try to       purchase shoes which have deeper heal seats or higher heel counters. 9.   Squeaking of orthotics devices in the shoes is due to the movement of the devices       when they are functioning normally.  To eliminate squeaking, simply dust some       baby powder into your shoes before inserting the devices.  If this does not work,          apply soap or wax to the edges of the orthotic devices or put a tissue into the shoes. 10. It is important that you follow these directions explicitly.  Failure to do so will simply       prolong the adjustment period or create problems which are easily avoided.  It makes       no difference if you are wearing your orthotic devices for only a few hours after        several months, so long as you are wearing them comfortably for those hours. 11. If you have any questions or complaints, contact our office.  We have no way of       knowing about your problems unless you tell us.  If we do not hear from you, we will       assume that you are proceeding well. ORTHOTIC INSTRUCTIONS   1) The shoe size most likely will increase 1/2 to one full size with your orthotic.  2) If the orthotic is full length, remove the shoe liner and replace it with your orthotic inside your shoe.  3) When purchasing new shoes, go midday and wear the thickness of socks you plan to wear with the shoe. Place orthotics in the shoes based on comfort. Orthotics may not fit in all shoe styles.  4) Wear the orthotic as long as they are comfortable and then remove. Restart the next day and continue this until you can wear the orthotic comfortably all day.

## 2014-01-31 NOTE — Progress Notes (Signed)
Patient ID: Victoria Sampson, female   DOB: Jun 21, 1971, 43 y.o.   MRN: [id number]  Subjective: This patient presents for followup care for bilateral fasciitis and dispensing of custom foot orthotics. Beginning pain level was 10 over 10 bilaterally and now is one over 10 bilaterally  Objective: Mild palpable tenderness medial plantar heels bilaterally Custom orthotics fit satisfactorily  Assessment: Improving bilateral fasciitis  Plan: Custom foot orthotics dispensed with wearing instructions provided  Recheck x6 weeks

## 2014-03-13 ENCOUNTER — Ambulatory Visit: Payer: 59 | Admitting: Podiatry

## 2014-06-12 ENCOUNTER — Encounter: Payer: Self-pay | Admitting: Podiatry

## 2014-06-12 ENCOUNTER — Ambulatory Visit (INDEPENDENT_AMBULATORY_CARE_PROVIDER_SITE_OTHER): Payer: 59 | Admitting: Podiatry

## 2014-06-12 VITALS — BP 145/87 | HR 74 | Resp 12

## 2014-06-12 DIAGNOSIS — M722 Plantar fascial fibromatosis: Secondary | ICD-10-CM

## 2014-06-13 DIAGNOSIS — M722 Plantar fascial fibromatosis: Secondary | ICD-10-CM

## 2014-06-13 MED ORDER — TRIAMCINOLONE ACETONIDE 10 MG/ML IJ SUSP
10.0000 mg | Freq: Once | INTRAMUSCULAR | Status: AC
  Administered 2014-06-13: 10 mg

## 2014-06-13 NOTE — Progress Notes (Signed)
Patient ID: Rocco Paulseresa T Bonzo, female   DOB: 1971-01-05, 43 y.o.   MRN: [id number]  Subjective: This patient presents again complaining of bilateral heel pain. She's had one Kenalog Injection each right and left heel on the visit of 12/24/2013. She is currently wearing custom orthotics since the visit of 01/30/2014 with minimal improvement of symptoms.  Objective: Orientated x3 Palpable tenderness medial plantar fascial area without any palpable lesions, bilaterally  Assessment: Bilateral insertional fasciitis  Plan: Offered patient Kenalog injection into each right and left heel and she verbally consents The skin is prepped with alcohol and Betadine and 10 mg of Kenalog mixed with 10 mg of plain Xylocaine and 2.5 mg of plain Marcaine are injected into right and left heels for Kenalog injections #2  Continue wearing custom foot orthotics in athletic style shoes Maintain bent knee stretching  Reappoint x6 weeks

## 2014-06-28 ENCOUNTER — Ambulatory Visit
Admission: RE | Admit: 2014-06-28 | Discharge: 2014-06-28 | Disposition: A | Discharge status: Home / Self Care | Payer: 59 | Source: Ambulatory Visit | Attending: Emergency Medicine | Admitting: Emergency Medicine

## 2014-06-28 ENCOUNTER — Other Ambulatory Visit: Payer: Self-pay | Admitting: Emergency Medicine

## 2014-06-28 DIAGNOSIS — T1490XA Injury, unspecified, initial encounter: Secondary | ICD-10-CM

## 2014-07-24 ENCOUNTER — Ambulatory Visit: Payer: 59 | Admitting: Podiatry

## 2015-01-13 ENCOUNTER — Other Ambulatory Visit: Payer: Self-pay

## 2015-01-13 DIAGNOSIS — Z1231 Encounter for screening mammogram for malignant neoplasm of breast: Secondary | ICD-10-CM

## 2015-01-20 ENCOUNTER — Ambulatory Visit
Admission: RE | Admit: 2015-01-20 | Discharge: 2015-01-20 | Disposition: A | Discharge status: Home / Self Care | Payer: 59 | Source: Ambulatory Visit

## 2015-01-20 DIAGNOSIS — Z1231 Encounter for screening mammogram for malignant neoplasm of breast: Secondary | ICD-10-CM

## 2015-03-10 ENCOUNTER — Emergency Department (HOSPITAL_COMMUNITY): Payer: Commercial Managed Care - HMO

## 2015-03-10 ENCOUNTER — Encounter (HOSPITAL_COMMUNITY): Payer: Self-pay | Admitting: Emergency Medicine

## 2015-03-10 ENCOUNTER — Emergency Department (HOSPITAL_COMMUNITY)
Admission: EM | Admit: 2015-03-10 | Discharge: 2015-03-10 | Disposition: A | Discharge status: Home / Self Care | Payer: Commercial Managed Care - HMO | Attending: Emergency Medicine | Admitting: Emergency Medicine

## 2015-03-10 DIAGNOSIS — M79602 Pain in left arm: Secondary | ICD-10-CM | POA: Diagnosis not present

## 2015-03-10 DIAGNOSIS — Z7952 Long term (current) use of systemic steroids: Secondary | ICD-10-CM | POA: Insufficient documentation

## 2015-03-10 DIAGNOSIS — Z8639 Personal history of other endocrine, nutritional and metabolic disease: Secondary | ICD-10-CM | POA: Insufficient documentation

## 2015-03-10 DIAGNOSIS — R110 Nausea: Secondary | ICD-10-CM | POA: Diagnosis not present

## 2015-03-10 DIAGNOSIS — Z794 Long term (current) use of insulin: Secondary | ICD-10-CM | POA: Diagnosis not present

## 2015-03-10 DIAGNOSIS — R0789 Other chest pain: Secondary | ICD-10-CM | POA: Diagnosis not present

## 2015-03-10 DIAGNOSIS — M549 Dorsalgia, unspecified: Secondary | ICD-10-CM | POA: Diagnosis not present

## 2015-03-10 DIAGNOSIS — I10 Essential (primary) hypertension: Secondary | ICD-10-CM | POA: Diagnosis not present

## 2015-03-10 DIAGNOSIS — E669 Obesity, unspecified: Secondary | ICD-10-CM | POA: Diagnosis not present

## 2015-03-10 DIAGNOSIS — R42 Dizziness and giddiness: Secondary | ICD-10-CM | POA: Insufficient documentation

## 2015-03-10 DIAGNOSIS — Z79899 Other long term (current) drug therapy: Secondary | ICD-10-CM | POA: Insufficient documentation

## 2015-03-10 LAB — CBC
HCT: 44.4 % (ref 36.0–46.0)
HEMOGLOBIN: 14.7 g/dL (ref 12.0–15.0)
MCH: 28.2 pg (ref 26.0–34.0)
MCHC: 33.1 g/dL (ref 30.0–36.0)
MCV: 85.1 fL (ref 78.0–100.0)
Platelets: 245 10*3/uL (ref 150–400)
RBC: 5.22 MIL/uL — ABNORMAL HIGH (ref 3.87–5.11)
RDW: 13.7 % (ref 11.5–15.5)
WBC: 5.1 10*3/uL (ref 4.0–10.5)

## 2015-03-10 LAB — COMPREHENSIVE METABOLIC PANEL
ALBUMIN: 3.8 g/dL (ref 3.5–5.0)
ALT: 12 U/L — ABNORMAL LOW (ref 14–54)
AST: 21 U/L (ref 15–41)
Alkaline Phosphatase: 54 U/L (ref 38–126)
Anion gap: 6 (ref 5–15)
BILIRUBIN TOTAL: 0.7 mg/dL (ref 0.3–1.2)
BUN: 6 mg/dL (ref 6–20)
CHLORIDE: 101 mmol/L (ref 101–111)
CO2: 30 mmol/L (ref 22–32)
Calcium: 8.7 mg/dL — ABNORMAL LOW (ref 8.9–10.3)
Creatinine, Ser: 0.65 mg/dL (ref 0.44–1.00)
Glucose, Bld: 84 mg/dL (ref 65–99)
Potassium: 4.4 mmol/L (ref 3.5–5.1)
SODIUM: 137 mmol/L (ref 135–145)
Total Protein: 7.8 g/dL (ref 6.5–8.1)

## 2015-03-10 LAB — T4, FREE: Free T4: 0.98 ng/dL (ref 0.61–1.12)

## 2015-03-10 LAB — PROTIME-INR
INR: 1.15 (ref 0.00–1.49)
Prothrombin Time: 14.8 seconds (ref 11.6–15.2)

## 2015-03-10 LAB — TROPONIN I: Troponin I: <0.03 ng/mL (ref <0.031)

## 2015-03-10 LAB — TSH: TSH: 2.05 u[IU]/mL (ref 0.350–4.500)

## 2015-03-10 LAB — MAGNESIUM: MAGNESIUM: 2.1 mg/dL (ref 1.7–2.4)

## 2015-03-10 MED ORDER — IBUPROFEN 600 MG PO TABS: 600.0000 mg | ORAL_TABLET | Freq: Three times a day (TID) | ORAL | Status: AC

## 2015-03-10 MED ORDER — TRAMADOL HCL 50 MG PO TABS: 50.0000 mg | ORAL_TABLET | Freq: Four times a day (QID) | ORAL | Status: DC | PRN

## 2015-03-10 NOTE — ED Notes (Signed)
Pt stable, ambulatory, states understanding of discharge instructions 

## 2015-03-10 NOTE — ED Notes (Signed)
Pt. Stated, I started having left arm and back pain on Saturday. Denies any injury.

## 2015-03-10 NOTE — Discharge Instructions (Signed)
As discussed, your evaluation today has been largely reassuring.  But, it is important that you monitor your condition carefully, and do not hesitate to return to the ED if you develop new, or concerning changes in your condition. ° °Otherwise, please follow-up with your physician for appropriate ongoing care. ° °Chest Pain (Nonspecific) °It is often hard to give a specific diagnosis for the cause of chest pain. There is always a chance that your pain could be related to something serious, such as a heart attack or a blood clot in the lungs. You need to follow up with your health care provider for further evaluation. °CAUSES  °· Heartburn. °· Pneumonia or bronchitis. °· Anxiety or stress. °· Inflammation around your heart (pericarditis) or lung (pleuritis or pleurisy). °· A blood clot in the lung. °· A collapsed lung (pneumothorax). It can develop suddenly on its own (spontaneous pneumothorax) or from trauma to the chest. °· Shingles infection (herpes zoster virus). °The chest wall is composed of bones, muscles, and cartilage. Any of these can be the source of the pain. °· The bones can be bruised by injury. °· The muscles or cartilage can be strained by coughing or overwork. °· The cartilage can be affected by inflammation and become sore (costochondritis). °DIAGNOSIS  °Lab tests or other studies may be needed to find the cause of your pain. Your health care provider may have you take a test called an ambulatory electrocardiogram (ECG). An ECG records your heartbeat patterns over a 24-hour period. You may also have other tests, such as: °· Transthoracic echocardiogram (TTE). During echocardiography, sound waves are used to evaluate how blood flows through your heart. °· Transesophageal echocardiogram (TEE). °· Cardiac monitoring. This allows your health care provider to monitor your heart rate and rhythm in real time. °· Holter monitor. This is a portable device that records your heartbeat and can help diagnose  heart arrhythmias. It allows your health care provider to track your heart activity for several days, if needed. °· Stress tests by exercise or by giving medicine that makes the heart beat faster. °TREATMENT  °· Treatment depends on what may be causing your chest pain. Treatment may include: °¨ Acid blockers for heartburn. °¨ Anti-inflammatory medicine. °¨ Pain medicine for inflammatory conditions. °¨ Antibiotics if an infection is present. °· You may be advised to change lifestyle habits. This includes stopping smoking and avoiding alcohol, caffeine, and chocolate. °· You may be advised to keep your head raised (elevated) when sleeping. This reduces the chance of acid going backward from your stomach into your esophagus. °Most of the time, nonspecific chest pain will improve within 2-3 days with rest and mild pain medicine.  °HOME CARE INSTRUCTIONS  °· If antibiotics were prescribed, take them as directed. Finish them even if you start to feel better. °· For the next few days, avoid physical activities that bring on chest pain. Continue physical activities as directed. °· Do not use any tobacco products, including cigarettes, chewing tobacco, or electronic cigarettes. °· Avoid drinking alcohol. °· Only take medicine as directed by your health care provider. °· Follow your health care provider's suggestions for further testing if your chest pain does not go away. °· Keep any follow-up appointments you made. If you do not go to an appointment, you could develop lasting (chronic) problems with pain. If there is any problem keeping an appointment, call to reschedule. °SEEK MEDICAL CARE IF:  °· Your chest pain does not go away, even after treatment. °· You have   a rash with blisters on your chest. °· You have a fever. °SEEK IMMEDIATE MEDICAL CARE IF:  °· You have increased chest pain or pain that spreads to your arm, neck, jaw, back, or abdomen. °· You have shortness of breath. °· You have an increasing cough, or you  cough up blood. °· You have severe back or abdominal pain. °· You feel nauseous or vomit. °· You have severe weakness. °· You faint. °· You have chills. °This is an emergency. Do not wait to see if the pain will go away. Get medical help at once. Call your local emergency services (911 in [place] Do not drive yourself to the hospital. °MAKE SURE YOU:  °· Understand these instructions. °· Will watch your condition. °· Will get help right away if you are not doing well or get worse. °Document Released: 05/26/2005 Document Revised: 08/21/2013 Document Reviewed: 03/21/2008 °[employer]® Patient Information ©2015 [employer]. This information is not intended to replace advice given to you by your health care provider. Make sure you discuss any questions you have with your health care provider. ° °

## 2015-03-10 NOTE — ED Notes (Signed)
Patient transported to X-ray 

## 2015-03-10 NOTE — ED Provider Notes (Addendum)
CSN: [id number]     Arrival date & time 03/10/15  1313 History   First MD Initiated Contact with Patient 03/10/15 1600     Chief Complaint  Patient presents with  . Arm Pain  . Back Pain  . Nausea     (Consider location/radiation/quality/duration/timing/severity/associated sxs/prior Treatment) HPI Pt. P/w 2d of upper back and L arm discomfort  Discomfort is sore, moderate with occasional inclusion of upper chest. No exertional / pleuritic pain, no syncope, no loss of strength or sensation in the arm. No n/v/d. No clear alleviating or exacerbating factors.  Past Medical History  Diagnosis Date  . Hypertension   . Hypothyroidism   . Obesity   . Allergic rhinitis   . Food allergy   . Allergy    Past Surgical History  Procedure Laterality Date  . Tubal ligation    . Cesarean section     No family history on file. History  Substance Use Topics  . Smoking status: Never Smoker   . Smokeless tobacco: Not on file  . Alcohol Use: No   OB History    No data available     Review of Systems  Constitutional:       Per HPI, otherwise negative  HENT:       Per HPI, otherwise negative  Respiratory:       Per HPI, otherwise negative  Cardiovascular:       Per HPI, otherwise negative  Gastrointestinal: Positive for nausea. Negative for vomiting.  Endocrine:       Negative aside from HPI  Genitourinary:       Neg aside from HPI   Musculoskeletal:       Per HPI, otherwise negative  Skin: Negative.   Allergic/Immunologic: Negative for immunocompromised state.  Neurological: Positive for light-headedness. Negative for syncope and weakness.      Allergies  Asa; Macrobid; and Tylenol  Home Medications   Prior to Admission medications   Medication Sig Start Date End Date Taking? Authorizing Provider  amLODipine (NORVASC) 5 MG tablet Take 5 mg by mouth daily.    Historical Provider, MD  azithromycin (ZITHROMAX) 250 MG tablet  03/07/14   Historical Provider, MD   cephALEXin (KEFLEX) 500 MG capsule Take 1 capsule (500 mg total) by mouth 3 (three) times daily. 12/18/13   Reuben Likesavid C Keller, MD  EPIPEN 2-PAK 0.3 MG/0.3ML SOAJ injection  01/18/14   Historical Provider, MD  famotidine (PEPCID) 10 MG tablet Take 10 mg by mouth 2 (two) times daily.    Historical Provider, MD  levocetirizine (XYZAL) 5 MG tablet Take 5 mg by mouth every evening.    Historical Provider, MD  phenazopyridine (PYRIDIUM) 200 MG tablet Take 1 tablet (200 mg total) by mouth 3 (three) times daily as needed for pain. 12/18/13   Reuben Likesavid C Keller, MD  predniSONE (DELTASONE) 10 MG tablet 5,4,3,2,1 taper 04/22/13   Elson AreasLeslie K Sofia, PA-C  predniSONE (DELTASONE) 20 MG tablet 2 tabs by mouth daily for 5 days. 12/20/12   Adlih Moreno-Coll, MD  spironolactone (ALDACTONE) 25 MG tablet Take 25 mg by mouth daily.    Historical Provider, MD   BP 157/76 mmHg  Pulse 60  Temp(Src) 98.2 F (36.8 C) (Oral)  Resp 17  Ht 5\' 6"  (1.676 m)  Wt 250 lb (113.399 kg)  BMI 40.37 kg/m2  LMP 03/07/2015 Physical Exam  Constitutional: She is oriented to person, place, and time. She appears well-developed and well-nourished. No distress.  HENT:  Head: Normocephalic and  atraumatic.  Eyes: Conjunctivae and EOM are normal.  Cardiovascular: Normal rate and regular rhythm.   Pulmonary/Chest: Effort normal and breath sounds normal. No stridor. No respiratory distress.  Abdominal: She exhibits no distension.  Musculoskeletal: She exhibits no edema.  Neurological: She is alert and oriented to person, place, and time. No cranial nerve deficit. She exhibits normal muscle tone. Coordination normal.  Skin: Skin is warm and dry.  Psychiatric: She has a normal mood and affect.  Nursing note and vitals reviewed.   ED Course  Procedures (including critical care time) Labs Review Labs Reviewed  CBC - Abnormal; Notable for the following:    RBC 5.22 (*)    All other components within normal limits  COMPREHENSIVE METABOLIC PANEL -  Abnormal; Notable for the following:    Calcium 8.7 (*)    ALT 12 (*)    All other components within normal limits  MAGNESIUM  PROTIME-INR  TROPONIN I  TSH  T4, FREE    Imaging Review Dg Chest 2 View  03/10/2015   CLINICAL DATA:  Left arm and back pain for 2 days. No known injury. Initial encounter.  EXAM: CHEST  2 VIEW  COMPARISON:  Radiographs 03/12/2008.  FINDINGS: The heart size and mediastinal contours are stable. There is linear atelectasis within the lingula. No edema, confluent airspace opacity or significant pleural effusion. The bones appear unremarkable.  IMPRESSION: Minimal linear atelectasis within the lingula. No acute findings or explanation for the patient's symptoms.   Electronically Signed   By: Carey Bullocks M.D.   On: 03/10/2015 18:09     EKG Interpretation   Date/Time:  Monday March 10 2015 16:18:00 EDT Ventricular Rate:  57 PR Interval:  167 QRS Duration: 95 QT Interval:  449 QTC Calculation: 437 R Axis:   81 Text Interpretation:  Sinus rhythm Nonspecific T abnormalities, anterior  leads Sinus rhythm T wave abnormality Abnormal ekg Confirmed by Gerhard Munch  MD ([id number]) on 03/10/2015 4:24:07 PM     Pulse oximetry 100% room air normal Cardiac 70 sinus normal  7:12 PM  Patient in no distress. Hemodynamically unchanged. I discussed all findings with her and her husband. We discussed trial of medication for pain control, primary care follow-up within 3 days, and the patient was agreeable to this plan. MDM  Patient presents with concern of ongoing arm, back pain, with occasional chest discomfort. Patient has a nonischemic EKG, unremarkable labs, vital signs are stable. Patient has minimal risk profile for ACS, pulmonary embolism, and with reassuring findings, there suspicion for musculoskeletal etiology. Patient discharged in stable condition with analgesia to follow-up with primary care.    Gerhard Munch, MD 03/10/15 1914  Gerhard Munch,  MD 03/19/15 [phone]

## 2016-11-22 ENCOUNTER — Encounter (HOSPITAL_COMMUNITY): Payer: Self-pay | Admitting: Emergency Medicine

## 2016-11-22 ENCOUNTER — Ambulatory Visit (HOSPITAL_COMMUNITY)
Admission: EM | Admit: 2016-11-22 | Discharge: 2016-11-22 | Disposition: A | Discharge status: Home / Self Care | Payer: Commercial Managed Care - HMO | Attending: Internal Medicine | Admitting: Internal Medicine

## 2016-11-22 DIAGNOSIS — T783XXA Angioneurotic edema, initial encounter: Secondary | ICD-10-CM | POA: Diagnosis not present

## 2016-11-22 MED ORDER — METHYLPREDNISOLONE ACETATE 80 MG/ML IJ SUSP
80.0000 mg | Freq: Once | INTRAMUSCULAR | Status: AC
  Administered 2016-11-22: 80 mg via INTRAMUSCULAR

## 2016-11-22 MED ORDER — DIPHENHYDRAMINE HCL 25 MG PO CAPS
ORAL_CAPSULE | ORAL | Status: AC
  Filled 2016-11-22: qty 2

## 2016-11-22 MED ORDER — FAMOTIDINE 20 MG PO TABS
20.0000 mg | ORAL_TABLET | Freq: Once | ORAL | Status: AC
  Administered 2016-11-22: 20 mg via ORAL

## 2016-11-22 MED ORDER — FAMOTIDINE 20 MG PO TABS
ORAL_TABLET | ORAL | Status: AC
  Filled 2016-11-22: qty 1

## 2016-11-22 MED ORDER — PREDNISONE 50 MG PO TABS: ORAL_TABLET | ORAL | 0 refills | Status: DC

## 2016-11-22 MED ORDER — DIPHENHYDRAMINE HCL 25 MG PO CAPS: 50.0000 mg | ORAL_CAPSULE | Freq: Once | ORAL | Status: DC

## 2016-11-22 MED ORDER — METHYLPREDNISOLONE ACETATE 80 MG/ML IJ SUSP
INTRAMUSCULAR | Status: AC
  Filled 2016-11-22: qty 1

## 2016-11-22 NOTE — ED Triage Notes (Addendum)
Pt woke up this morning with some left facial and mouth swelling.  She denies any swelling of the throat or tongue at this time.  Pt has an Epi-Pen, but it expired last month and she is trying to get a refill for it now from her PCP.  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  Pt took Zyrtec this morning with no relief.

## 2016-11-22 NOTE — Discharge Instructions (Signed)
Treating you today for angioedema. You've been given an injection of prednisone, a tablet of Pepcid, and  50 mg Benadryl. I sent a prescription to your pharmacy for prednisone, take one tablet daily with food. I recommend over-the-counter Benadryl, 2 tablets or 50 mg every 6 hours, not to exceed 300 mg in any 24-hour period. Recommend you call your primary care provider in 2-3 days as needed, or go to the emergency room immediately if he experiences any shortness of breath, difficulty swallowing, wheezing, worsened itching, or other symptoms.

## 2016-11-22 NOTE — ED Provider Notes (Signed)
CSN: [id number]     Arrival date & time 11/22/16  1039 History   None    Chief Complaint  Patient presents with  . Facial Swelling   (Consider location/radiation/quality/duration/timing/severity/associated sxs/prior Treatment) 46 year old female presents to clinic for evaluation of possible allergic reaction. Has facial swelling the left side, denies any difficulty breathing, denies any difficulty swallowing, states she does have an EpiPen, however she has not used it as it expired in February. She has no shortness of breath, no weakness, dizziness, or other complaints. She has multiple allergies including citrus products fish products tree and nut products and she states she may have had an exposure last night dinner.   The history is provided by the patient.  Allergic Reaction  Presenting symptoms: itching   Presenting symptoms: no wheezing   Presenting symptoms comment:  Facial swelling Severity:  Mild Duration:  1 day Prior allergic episodes:  Allergies to medications, food/nut allergies and seasonal allergies Context: food   Context: not animal exposure, not insect bite/sting, not jewelry/metal and not medications   Relieved by:  Antihistamines Worsened by:  Nothing Ineffective treatments:  None tried   Past Medical History:  Diagnosis Date  . Allergic rhinitis   . Allergy   . Food allergy   . Hypertension   . Hypothyroidism   . Obesity    Past Surgical History:  Procedure Laterality Date  . CESAREAN SECTION    . TUBAL LIGATION     History reviewed. No pertinent family history. Social History  Substance Use Topics  . Smoking status: Never Smoker  . Smokeless tobacco: Never Used  . Alcohol use No   OB History    No data available     Review of Systems  Respiratory: Negative for choking, chest tightness, shortness of breath, wheezing and stridor.   Gastrointestinal: Negative for abdominal pain, diarrhea, nausea and rectal pain.  Skin: Positive for itching.    Allergic/Immunologic: Positive for environmental allergies and food allergies.  Neurological: Negative for dizziness and weakness.  All other systems reviewed and are negative.   Allergies  Asa [aspirin]; Eggs or egg-derived products; Garlic; Lemonade oil flavor; Macrobid [nitrofurantoin macrocrystal]; Other; Shrimp [shellfish allergy]; and Tylenol [acetaminophen]  Home Medications   Prior to Admission medications   Medication Sig Start Date End Date Taking? Authorizing Provider  amLODipine (NORVASC) 5 MG tablet Take 5 mg by mouth daily.   Yes Historical Provider, MD  levocetirizine (XYZAL) 5 MG tablet Take 5 mg by mouth every evening.   Yes Historical Provider, MD  spironolactone (ALDACTONE) 25 MG tablet Take 25 mg by mouth daily.   Yes Historical Provider, MD  EPIPEN 2-PAK 0.3 MG/0.3ML SOAJ injection  01/18/14   Historical Provider, MD  predniSONE (DELTASONE) 50 MG tablet Take 1 tablet daily with food 11/22/16   Dorena Bodo, NP   Meds Ordered and Administered this Visit   Medications  diphenhydrAMINE (BENADRYL) capsule 50 mg (50 mg Oral Not Given 11/22/16 1234)  methylPREDNISolone acetate (DEPO-MEDROL) injection 80 mg (80 mg Intramuscular Given 11/22/16 1219)  famotidine (PEPCID) tablet 20 mg (20 mg Oral Given 11/22/16 1219)    BP (!) 150/90 (BP Location: Left Arm)   Pulse 65   Temp 98.8 F (37.1 C) (Oral)   SpO2 100%  No data found.   Physical Exam  Constitutional: She is oriented to person, place, and time. She appears well-developed and well-nourished. She does not have a sickly appearance. She does not appear ill. No distress.  HENT:  Head: Normocephalic and atraumatic.    Right Ear: Tympanic membrane and external ear normal.  Left Ear: Tympanic membrane and external ear normal.  Nose: Nose normal. Right sinus exhibits no maxillary sinus tenderness and no frontal sinus tenderness. Left sinus exhibits no maxillary sinus tenderness and no frontal sinus tenderness.   Mouth/Throat: Uvula is midline, oropharynx is clear and moist and mucous membranes are normal. No oropharyngeal exudate. Tonsils are 0 on the right. Tonsils are 0 on the left. No tonsillar exudate.  Eyes: Pupils are equal, round, and reactive to light.  Neck: Normal range of motion. Neck supple. No JVD present.  Cardiovascular: Normal rate and regular rhythm.   Pulmonary/Chest: Effort normal and breath sounds normal. No respiratory distress. She has no wheezes.  Abdominal: Soft. Bowel sounds are normal. She exhibits no distension. There is no tenderness. There is no guarding.  Lymphadenopathy:    She has no cervical adenopathy.  Neurological: She is alert and oriented to person, place, and time.  Skin: Skin is warm and dry. Capillary refill takes less than 2 seconds. She is not diaphoretic.  Psychiatric: She has a normal mood and affect. Her behavior is normal.  Nursing note and vitals reviewed.   Urgent Care Course     Procedures (including critical care time)  Labs Review Labs Reviewed - No data to display  Imaging Review No results found.       MDM   1. Angioedema, initial encounter    Orders given for Benadryl, Depo-Medrol, Pepcid in clinic. Patient discharged home on prescription for prednisone. Advised to take over-the-counter Benadryl 50 mg every 6 hours. Given strict follow-up guidelines. Recommended following up with primary care provider in 2-3 days if symptoms fail to resolve, go to the emergency room at any time if symptoms worsen.      Dorena BodoLawrence Nomar Broad, NP 11/22/16 1256

## 2017-02-12 ENCOUNTER — Encounter (HOSPITAL_COMMUNITY): Payer: Self-pay

## 2017-02-12 ENCOUNTER — Emergency Department (HOSPITAL_COMMUNITY)
Admission: EM | Admit: 2017-02-12 | Discharge: 2017-02-12 | Disposition: A | Discharge status: Home / Self Care | Payer: Commercial Managed Care - HMO | Attending: Emergency Medicine | Admitting: Emergency Medicine

## 2017-02-12 DIAGNOSIS — Z79899 Other long term (current) drug therapy: Secondary | ICD-10-CM | POA: Insufficient documentation

## 2017-02-12 DIAGNOSIS — T783XXA Angioneurotic edema, initial encounter: Secondary | ICD-10-CM | POA: Insufficient documentation

## 2017-02-12 DIAGNOSIS — E039 Hypothyroidism, unspecified: Secondary | ICD-10-CM | POA: Insufficient documentation

## 2017-02-12 DIAGNOSIS — R600 Localized edema: Secondary | ICD-10-CM | POA: Diagnosis present

## 2017-02-12 DIAGNOSIS — I10 Essential (primary) hypertension: Secondary | ICD-10-CM | POA: Diagnosis not present

## 2017-02-12 LAB — BASIC METABOLIC PANEL
Anion gap: 8 (ref 5–15)
BUN: 9 mg/dL (ref 6–20)
CO2: 26 mmol/L (ref 22–32)
Calcium: 9.1 mg/dL (ref 8.9–10.3)
Chloride: 100 mmol/L — ABNORMAL LOW (ref 101–111)
Creatinine, Ser: 0.79 mg/dL (ref 0.44–1.00)
GFR calc Af Amer: >60 mL/min (ref >60)
Glucose, Bld: 103 mg/dL — ABNORMAL HIGH (ref 65–99)
Potassium: 3.8 mmol/L (ref 3.5–5.1)
Sodium: 134 mmol/L — ABNORMAL LOW (ref 135–145)

## 2017-02-12 LAB — CBC WITH DIFFERENTIAL/PLATELET
Basophils Absolute: 0.1 10*3/uL (ref 0.0–0.1)
Basophils Relative: 1 %
EOS PCT: 3 %
Eosinophils Absolute: 0.2 10*3/uL (ref 0.0–0.7)
HCT: 42.9 % (ref 36.0–46.0)
Hemoglobin: 14 g/dL (ref 12.0–15.0)
LYMPHS ABS: 2.7 10*3/uL (ref 0.7–4.0)
LYMPHS PCT: 35 %
MCH: 28.5 pg (ref 26.0–34.0)
MCHC: 32.6 g/dL (ref 30.0–36.0)
MCV: 87.2 fL (ref 78.0–100.0)
MONOS PCT: 9 %
Monocytes Absolute: 0.7 10*3/uL (ref 0.1–1.0)
Neutro Abs: 4.2 10*3/uL (ref 1.7–7.7)
Neutrophils Relative %: 52 %
PLATELETS: 258 10*3/uL (ref 150–400)
RBC: 4.92 MIL/uL (ref 3.87–5.11)
RDW: 13.7 % (ref 11.5–15.5)
WBC: 7.9 10*3/uL (ref 4.0–10.5)

## 2017-02-12 MED ORDER — DIPHENHYDRAMINE HCL 25 MG PO TABS: 25.0000 mg | ORAL_TABLET | Freq: Four times a day (QID) | ORAL | 0 refills | Status: DC | PRN

## 2017-02-12 MED ORDER — FAMOTIDINE IN NACL 20-0.9 MG/50ML-% IV SOLN
20.0000 mg | Freq: Once | INTRAVENOUS | Status: AC
  Administered 2017-02-12: 20 mg via INTRAVENOUS
  Filled 2017-02-12: qty 50

## 2017-02-12 MED ORDER — METHYLPREDNISOLONE SODIUM SUCC 125 MG IJ SOLR
125.0000 mg | Freq: Once | INTRAMUSCULAR | Status: AC
  Administered 2017-02-12: 125 mg via INTRAVENOUS
  Filled 2017-02-12: qty 2

## 2017-02-12 MED ORDER — DIPHENHYDRAMINE HCL 50 MG/ML IJ SOLN
25.0000 mg | Freq: Once | INTRAMUSCULAR | Status: DC
  Filled 2017-02-12: qty 1

## 2017-02-12 MED ORDER — PREDNISONE 20 MG PO TABS: ORAL_TABLET | ORAL | 0 refills | Status: DC

## 2017-02-12 MED ORDER — DIPHENHYDRAMINE HCL 50 MG/ML IJ SOLN
25.0000 mg | Freq: Once | INTRAMUSCULAR | Status: AC
  Administered 2017-02-12: 25 mg via INTRAVENOUS
  Filled 2017-02-12: qty 1

## 2017-02-12 NOTE — ED Notes (Signed)
Pt voices understanding of discharge instructions. NAD at discharge.

## 2017-02-12 NOTE — Discharge Instructions (Addendum)
Take the prescribed medication as directed.  Recommend to stop your amlodipine as this can sometimes cause oral swelling. Keep epi pen nearby in case you start to have throat/tongue swelling. If epipen is used, you need to come to the ED to be evaluated. Follow-up with your primary care doctor. Return to the ED for new or worsening symptoms.

## 2017-02-12 NOTE — ED Notes (Signed)
Pt ambulated to bathroom with a steady gait and no issues

## 2017-02-12 NOTE — ED Triage Notes (Signed)
Pt complaining of lip swelling this evening. Pt states had root canal today. Pt with increased lip swelling this evening. Pt complaining of throat tightness. Pt states difficulty swallowing.

## 2017-02-12 NOTE — ED Provider Notes (Signed)
  6:31 AM [facility] Manus Gunningancour. See their notes for full H&P. Patient with root canal yesterday and angioedema developing last evening.  Is currently on amoxicillin, but no known allergy to penicillin.  On my evaluation patient does have some swelling of the lips but no tongue or oropharyngeal involvement. She is handling her secretions well. She has normal phonation without stridor.  No apparent abscess formation noted.  Plan:  Second dose of benadryl, pepcid.  If no change over the next hour, can discharge home.  If worsening, may need admission.  7:30 AM Patient reassessed alongside Dr. Manus Gunningancour-- Lips are mildly improved from prior evaluation.  Remains without any tongue or airway involvement.  Has been resting here.  Vitals stable.  At this point, feel she is stable for discharge.  Will continue prednisone and benadryl at home.  Has epi-pen, understands when use is indicated and need for emergent evaluation if used.  Will also have her hold her amlodipine in case this is cause of symptoms.  Can follow-up with PCP. Discussed plan with patient, she acknowledged understanding and agreed with plan of care.  Return precautions given for new or worsening symptoms.   Garlon HatchetSanders, Lisa M, PA-C 02/12/17 [phone]    Glynn Octaveancour, Stephen, MD 02/12/17 [phone]

## 2017-02-12 NOTE — ED Provider Notes (Signed)
By signing my name below, I, Levon HedgerElizabeth Hall, attest that this documentation has been prepared under the direction and in the presence of Janessa Mickle, Jeannett SeniorStephen, MD . Electronically Signed: Levon HedgerElizabeth Hall, Scribe. 02/12/2017. 2:37 AM.   Victoria Sampson is a 46 y.o. female who presents to the Emergency Department complaining of progressively worsening lip swelling onset this evening. She reports associated difficulty swallowing and throat tightness. Pt had a root canal his morning on her upper left rear molar. Per pt, she has some mild "puffiness" to her lips when she left the dentist, but this significantly worsened after taking a nap this afternoon. She is on amoxicillin as prescribed by her dentist, but has never had a reaction to this medication in the past. Per pt, she is still somewhat numb from her root canal. She denies difficulty breathing.   Dentist: Marga MelnickElizabeth Applebaum   On exam: lower lip swelling. No tongue swelling. Posterior oropharynx is clear. No wheezing.   Lungs are clear.   Patient will be given antihistamines and steroids. She is not on an ACE inhibitor but does take amlodipine which she'll be instructed to discontinue.  I personally performed the services described in this documentation, which was scribed in my presence. The recorded information has been reviewed and is accurate.    Glynn Octaveancour, Laurette Villescas, MD 02/12/17 [phone]

## 2017-02-12 NOTE — ED Provider Notes (Signed)
[facility]-EMERGENCY DEPT Provider Note   CSN: [id number] Arrival date & time: 02/12/17  0118     History   Chief Complaint Chief Complaint  Patient presents with  . Oral Swelling    HPI Victoria Sampson is a 46 y.o. female.  Patient presents to the emergency department with chief complaint of facial and lip swelling. She states that the swelling started this evening and has progressively worsened. She reports some associated throat tightness. She denies difficulty swallowing or breathing. She reports that she had a root canal this morning on her top left rear molar. She states that her pain is mild. She denies any fevers or chills, but does state that she has felt nauseated. She reports having history of allergic reaction and angioedema to different foods such as not so, but the patient denies having eaten anything today or recently. She denies eating anything out of the ordinary. She is on amoxicillin as prescribed by her dentist, but states she has never had a reaction to this.   The history is provided by the patient. No language interpreter was used.    Past Medical History:  Diagnosis Date  . Allergic rhinitis   . Allergy   . Food allergy   . Hypertension   . Hypothyroidism   . Obesity     There are no active problems to display for this patient.   Past Surgical History:  Procedure Laterality Date  . CESAREAN SECTION    . TUBAL LIGATION      OB History    No data available       Home Medications    Prior to Admission medications   Medication Sig Start Date End Date Taking? Authorizing Provider  amLODipine (NORVASC) 5 MG tablet Take 5 mg by mouth daily.    [provider]  EPIPEN 2-PAK 0.3 MG/0.3ML SOAJ injection  01/18/14   [provider]  levocetirizine (XYZAL) 5 MG tablet Take 5 mg by mouth every evening.    [provider]  predniSONE (DELTASONE) 50 MG tablet Take 1 tablet daily with food 11/22/16   Dorena BodoKennard, Lawrence, NP    spironolactone (ALDACTONE) 25 MG tablet Take 25 mg by mouth daily.    [provider]    Family History History reviewed. No pertinent family history.  Social History Social History  Substance Use Topics  . Smoking status: Never Smoker  . Smokeless tobacco: Never Used  . Alcohol use No     Allergies   Asa [aspirin]; Eggs or egg-derived products; Garlic; Lemonade oil flavor; Macrobid [nitrofurantoin macrocrystal]; Other; Shrimp [shellfish allergy]; and Tylenol [acetaminophen]   Review of Systems Review of Systems  All other systems reviewed and are negative.    Physical Exam Updated Vital Signs BP (!) 170/90   Pulse (!) 57   Temp 98.3 F (36.8 C) (Oral)   Resp 16   LMP 01/24/2017 (Approximate)   SpO2 100%   Physical Exam  Constitutional: She is oriented to person, place, and time. She appears well-developed and well-nourished.  HENT:  Head: Normocephalic and atraumatic.  Left cheek and lower lip swelling without tenderness, there is some tenderness of the left sided subcutaneous mandibular space, no sublingual tenderness, no stridor, airway intact, normal phonation  Eyes: Conjunctivae and EOM are normal. Pupils are equal, round, and reactive to light.  Neck: Normal range of motion. Neck supple.  Cardiovascular: Normal rate and regular rhythm.  Exam reveals no gallop and no friction rub.   No murmur heard.  Pulmonary/Chest: Effort normal and breath sounds normal. No respiratory distress. She has no wheezes. She has no rales. She exhibits no tenderness.  Abdominal: Soft. Bowel sounds are normal. She exhibits no distension and no mass. There is no tenderness. There is no rebound and no guarding.  Musculoskeletal: Normal range of motion. She exhibits no edema or tenderness.  Neurological: She is alert and oriented to person, place, and time.  Skin: Skin is warm and dry.  Psychiatric: She has a normal mood and affect. Her behavior is normal. Judgment and thought  content normal.  Nursing note and vitals reviewed.    ED Treatments / Results  Labs (all labs ordered are listed, but only abnormal results are displayed) Labs Reviewed  CBC WITH DIFFERENTIAL/PLATELET  BASIC METABOLIC PANEL    EKG  EKG Interpretation None       Radiology No results found.  Procedures Procedures (including critical care time)  Medications Ordered in ED Medications  methylPREDNISolone sodium succinate (SOLU-MEDROL) 125 mg/2 mL injection 125 mg (not administered)  diphenhydrAMINE (BENADRYL) injection 25 mg (not administered)  famotidine (PEPCID) IVPB 20 mg premix (not administered)     Initial Impression / Assessment and Plan / ED Course  I have reviewed the triage vital signs and the nursing notes.  Pertinent labs & imaging results that were available during my care of the patient were reviewed by me and considered in my medical decision making (see chart for details).     Patient with lip swelling and throat tightness sensation. Patient had a root canal performed earlier today. It is difficult to know with any certainty on was the patient's swelling is related to complication from the procedure, infection, or allergic reaction, therefore will check CT of her neck. Will give her Solu-Medrol, Benadryl, and Pepcid for now, and will reassess. Patient's airway is intact. She has no stridor. I discussed patient with Dr. Manus Gunning.  6:21 AM Patient's symptoms have worsened slightly.  No throat swelling or difficulty breathing.  Seen by and discussed with Dr. Manus Gunning again, who recommends giving another dose of meds and observing for a couple hours.    Patient signed out to [place], PA-C.  Final Clinical Impressions(s) / ED Diagnoses   Final diagnoses:  Angioedema, initial encounter    New Prescriptions New Prescriptions   No medications on file     Roxy Horseman, Cordelia Poche 02/12/17 1610    Glynn Octave, MD 02/12/17 [phone]

## 2017-06-05 ENCOUNTER — Emergency Department (HOSPITAL_COMMUNITY)
Admission: EM | Admit: 2017-06-05 | Discharge: 2017-06-05 | Disposition: A | Discharge status: Home / Self Care | Payer: 59 | Attending: Emergency Medicine | Admitting: Emergency Medicine

## 2017-06-05 ENCOUNTER — Encounter (HOSPITAL_COMMUNITY): Payer: Self-pay | Admitting: Emergency Medicine

## 2017-06-05 DIAGNOSIS — T7840XA Allergy, unspecified, initial encounter: Secondary | ICD-10-CM | POA: Diagnosis not present

## 2017-06-05 DIAGNOSIS — E039 Hypothyroidism, unspecified: Secondary | ICD-10-CM | POA: Insufficient documentation

## 2017-06-05 DIAGNOSIS — Z79899 Other long term (current) drug therapy: Secondary | ICD-10-CM | POA: Insufficient documentation

## 2017-06-05 DIAGNOSIS — R600 Localized edema: Secondary | ICD-10-CM | POA: Diagnosis present

## 2017-06-05 DIAGNOSIS — I10 Essential (primary) hypertension: Secondary | ICD-10-CM | POA: Insufficient documentation

## 2017-06-05 MED ORDER — LORATADINE 10 MG PO TABS
10.0000 mg | ORAL_TABLET | Freq: Once | ORAL | Status: DC
  Filled 2017-06-05: qty 1

## 2017-06-05 MED ORDER — EPINEPHRINE 0.3 MG/0.3ML IJ SOAJ: 0.3000 mg | INTRAMUSCULAR | 1 refills | Status: AC | PRN

## 2017-06-05 MED ORDER — DIPHENHYDRAMINE HCL 50 MG/ML IJ SOLN: 25.0000 mg | Freq: Once | INTRAMUSCULAR | Status: DC

## 2017-06-05 MED ORDER — METHYLPREDNISOLONE SODIUM SUCC 125 MG IJ SOLR
125.0000 mg | Freq: Once | INTRAMUSCULAR | Status: AC
  Administered 2017-06-05: 125 mg via INTRAVENOUS
  Filled 2017-06-05: qty 2

## 2017-06-05 MED ORDER — FAMOTIDINE IN NACL 20-0.9 MG/50ML-% IV SOLN
20.0000 mg | Freq: Once | INTRAVENOUS | Status: AC
  Administered 2017-06-05: 20 mg via INTRAVENOUS
  Filled 2017-06-05: qty 50

## 2017-06-05 MED ORDER — PREDNISONE 50 MG PO TABS: 50.0000 mg | ORAL_TABLET | Freq: Every day | ORAL | 0 refills | Status: DC

## 2017-06-05 NOTE — ED Provider Notes (Signed)
[facility]-EMERGENCY DEPT Provider Note   CSN: [id number] Arrival date & time: 06/05/17  0636     History   Chief Complaint Chief Complaint  Patient presents with  . Allergic Reaction    HPI Victoria Sampson is a 46 y.o. female.  HPI Patient presents to the emergency room for evaluation of an allergic reaction. Patient has a prior history of angioedema.  Patient states she woke up this morning and had some swelling of her lips. She has a history of prior allergic reaction so she gave herself an EpiPen and came to the emergency room. Patient is not sure what triggered this episode. She denies eating anything. She did not take any medications. Currently she is feeling a little bit better but she does still feel a tightness in her lips. She has not noticed any rashes. No difficulty swallowing or breathing Past Medical History:  Diagnosis Date  . Allergic rhinitis   . Allergy   . Food allergy   . Hypertension   . Hypothyroidism   . Obesity     There are no active problems to display for this patient.   Past Surgical History:  Procedure Laterality Date  . CESAREAN SECTION    . TUBAL LIGATION      OB History    No data available       Home Medications    Prior to Admission medications   Medication Sig Start Date End Date Taking? Authorizing Provider  cetirizine (ZYRTEC) 10 MG tablet Take 10 mg by mouth daily as needed for allergies.   Yes [provider]  Cholecalciferol (VITAMIN D3) 50000 units CAPS Take 1 capsule by mouth once a week. On Sundays 05/05/17  Yes [provider]  fluticasone (FLONASE) 50 MCG/ACT nasal spray Place 1 spray into both nostrils 2 (two) times daily as needed for allergies.  02/01/17  Yes [provider]  levocetirizine (XYZAL) 5 MG tablet Take 5 mg by mouth every evening.   Yes [provider]  spironolactone (ALDACTONE) 25 MG tablet Take 25 mg by mouth 2 (two) times daily.    Yes [provider]  EPINEPHrine  (EPIPEN 2-PAK) 0.3 mg/0.3 mL IJ SOAJ injection Inject 0.3 mLs (0.3 mg total) into the muscle as needed (for allergic reaction). 06/05/17   Linwood Dibbles, MD  predniSONE (DELTASONE) 50 MG tablet Take 1 tablet (50 mg total) by mouth daily. 06/05/17   Linwood Dibbles, MD    Family History History reviewed. No pertinent family history.  Social History Social History  Substance Use Topics  . Smoking status: Never Smoker  . Smokeless tobacco: Never Used  . Alcohol use No     Allergies   Asa [aspirin]; Benadryl [diphenhydramine]; Eggs or egg-derived products; Garlic; Lemon oil; Lentil; Macrobid [nitrofurantoin macrocrystal]; Peanut-containing drug products; Shrimp [shellfish allergy]; and Tylenol [acetaminophen]   Review of Systems Review of Systems  All other systems reviewed and are negative.    Physical Exam Updated Vital Signs BP 117/78   Pulse 75   Temp 98.1 F (36.7 C) (Oral)   Resp 20   Ht 1.676 m ( )   Wt 112 kg (247 lb)   SpO2 98%   BMI 39.87 kg/m   Physical Exam  Constitutional: She appears well-developed and well-nourished. No distress.  HENT:  Head: Normocephalic and atraumatic.  Right Ear: External ear normal.  Left Ear: External ear normal.  ?lip edema  Eyes: Conjunctivae are normal. Right eye exhibits no discharge. Left eye exhibits no  discharge. No scleral icterus.  Neck: Neck supple. No tracheal deviation present.  Cardiovascular: Normal rate, regular rhythm and intact distal pulses.   Pulmonary/Chest: Effort normal and breath sounds normal. No stridor. No respiratory distress. She has no wheezes. She has no rales.  Abdominal: Soft. Bowel sounds are normal. She exhibits no distension. There is no tenderness. There is no rebound and no guarding.  Musculoskeletal: She exhibits no edema or tenderness.  Neurological: She is alert. She has normal strength. No cranial nerve deficit (no facial droop, extraocular movements intact, no slurred speech) or sensory deficit.  She exhibits normal muscle tone. She displays no seizure activity. Coordination normal.  Skin: Skin is warm and dry. No rash noted.  Psychiatric: She has a normal mood and affect.  Nursing note and vitals reviewed.    ED Treatments / Results  Labs (all labs ordered are listed, but only abnormal results are displayed) Labs Reviewed - No data to display  Radiology No results found.  Procedures Procedures (including critical care time)  Medications Ordered in ED Medications  diphenhydrAMINE (BENADRYL) injection 25 mg (25 mg Intravenous Refused 06/05/17 0704)  loratadine (CLARITIN) tablet 10 mg (0 mg Oral Hold 06/05/17 0809)  methylPREDNISolone sodium succinate (SOLU-MEDROL) 125 mg/2 mL injection 125 mg (125 mg Intravenous Given 06/05/17 0708)  famotidine (PEPCID) IVPB 20 mg premix (0 mg Intravenous Stopped 06/05/17 0746)     Initial Impression / Assessment and Plan / ED Course  I have reviewed the triage vital signs and the nursing notes.  Pertinent labs & imaging results that were available during my care of the patient were reviewed by me and considered in my medical decision making (see chart for details).  Clinical Course as of Jun 05 953  Wynelle Link Jun 05, 2017  0909 No signs of worsening sx.  Speaking and breathing easily  [JK]  0954 Pt feels ready to go.    [JK]    Clinical Course User Index [JK] Linwood Dibbles, MD    Pt with history of angioedema and allergic reaction.  Monitored in the ED.  No overt signs of swelling.  Sx improved.  Stable for discharge  Final Clinical Impressions(s) / ED Diagnoses   Final diagnoses:  Allergic reaction, initial encounter    New Prescriptions New Prescriptions   PREDNISONE (DELTASONE) 50 MG TABLET    Take 1 tablet (50 mg total) by mouth daily.     Linwood Dibbles, MD 06/05/17 [phone]

## 2017-06-05 NOTE — ED Notes (Signed)
DC instructions along with both Rx written by EDP reviewed, and discussed with pt and significant other. Discussed when the need may arise to return to ED if needed and what those S&S would be. Opportunity for questions provided

## 2017-06-05 NOTE — ED Triage Notes (Signed)
Pt woke up this morning, inner part of lips began to swell shut, pt to a zyrtec, brushed teeth. Then gave her self the epi pen, and came in to ed.  Inner mouth edema scared her

## 2017-06-05 NOTE — ED Triage Notes (Signed)
Pt reports waking up around 5:30 and feeling like mouth and throat was closing. Pt used epi pen appx 15 min ago. Swelling noted to face.

## 2017-06-05 NOTE — Discharge Instructions (Signed)
Follow up with your primary care doctor or allergist as needed, continue antihistamines, take the steroids as prescribed

## 2017-06-05 NOTE — ED Notes (Signed)
Pt ready for DC to home. Voices no complaints at this time. BS along with Tracheal sounds noted to be clear. No dysphagia or dysphonia noted. Pt fully alert and oriented. Significant other at bedside with pt

## 2017-06-05 NOTE — ED Notes (Signed)
EDP at bedside to speak with pt and inform pt of Rx written

## 2019-01-23 ENCOUNTER — Other Ambulatory Visit: Payer: Self-pay | Admitting: Cardiology

## 2019-12-11 ENCOUNTER — Other Ambulatory Visit: Payer: Self-pay | Admitting: Internal Medicine

## 2019-12-11 ENCOUNTER — Ambulatory Visit
Admission: RE | Admit: 2019-12-11 | Discharge: 2019-12-11 | Disposition: A | Discharge status: Home / Self Care | Payer: 59 | Source: Ambulatory Visit | Attending: Internal Medicine | Admitting: Internal Medicine

## 2019-12-11 DIAGNOSIS — M25561 Pain in right knee: Secondary | ICD-10-CM

## 2020-01-03 ENCOUNTER — Telehealth: Payer: Self-pay

## 2020-01-03 NOTE — Telephone Encounter (Signed)
lmom for pt to call back to schedule 38yr f/u for mvp per JG. Records in [employer]

## 2020-01-15 ENCOUNTER — Telehealth: Payer: Self-pay

## 2020-01-15 NOTE — Telephone Encounter (Signed)
2nd attempt: lmom for pt to call back to schedule 56yr f/u for mvp per JG. Records in [employer]

## 2020-04-14 ENCOUNTER — Other Ambulatory Visit: Payer: Self-pay | Admitting: Internal Medicine

## 2020-04-14 ENCOUNTER — Ambulatory Visit
Admission: RE | Admit: 2020-04-14 | Discharge: 2020-04-14 | Disposition: A | Discharge status: Home / Self Care | Payer: 59 | Source: Ambulatory Visit | Attending: Internal Medicine | Admitting: Internal Medicine

## 2020-04-14 DIAGNOSIS — M542 Cervicalgia: Secondary | ICD-10-CM

## 2020-04-14 DIAGNOSIS — M25512 Pain in left shoulder: Secondary | ICD-10-CM

## 2020-04-14 DIAGNOSIS — M549 Dorsalgia, unspecified: Secondary | ICD-10-CM

## 2020-04-14 DIAGNOSIS — M545 Low back pain, unspecified: Secondary | ICD-10-CM

## 2020-11-27 ENCOUNTER — Ambulatory Visit
Admission: RE | Admit: 2020-11-27 | Discharge: 2020-11-27 | Disposition: A | Discharge status: Home / Self Care | Payer: 59 | Source: Ambulatory Visit | Attending: Internal Medicine | Admitting: Internal Medicine

## 2020-11-27 ENCOUNTER — Other Ambulatory Visit: Payer: Self-pay | Admitting: Internal Medicine

## 2020-11-27 DIAGNOSIS — R059 Cough, unspecified: Secondary | ICD-10-CM

## 2020-11-27 DIAGNOSIS — R918 Other nonspecific abnormal finding of lung field: Secondary | ICD-10-CM

## 2021-06-23 ENCOUNTER — Other Ambulatory Visit: Payer: Self-pay | Admitting: Orthopaedic Surgery

## 2021-06-23 DIAGNOSIS — S83241A Other tear of medial meniscus, current injury, right knee, initial encounter: Secondary | ICD-10-CM

## 2021-07-05 ENCOUNTER — Ambulatory Visit
Admission: RE | Admit: 2021-07-05 | Discharge: 2021-07-05 | Disposition: A | Discharge status: Home / Self Care | Payer: 59 | Source: Ambulatory Visit | Attending: Orthopaedic Surgery | Admitting: Orthopaedic Surgery

## 2021-07-05 DIAGNOSIS — S83241A Other tear of medial meniscus, current injury, right knee, initial encounter: Secondary | ICD-10-CM

## 2021-07-10 ENCOUNTER — Other Ambulatory Visit (HOSPITAL_BASED_OUTPATIENT_CLINIC_OR_DEPARTMENT_OTHER): Payer: Self-pay

## 2021-07-10 DIAGNOSIS — R5383 Other fatigue: Secondary | ICD-10-CM

## 2021-07-10 DIAGNOSIS — G4710 Hypersomnia, unspecified: Secondary | ICD-10-CM

## 2021-07-10 DIAGNOSIS — R0683 Snoring: Secondary | ICD-10-CM

## 2021-08-14 IMAGING — CR DG KNEE 1-2V*R*
2 series · 2 of 2 positions shown · non-contrast
Comparison: None.

CLINICAL DATA: Right knee pain and weakness for 3 months

EXAM:
RIGHT KNEE - 1-2 VIEW

[t knee ap right]
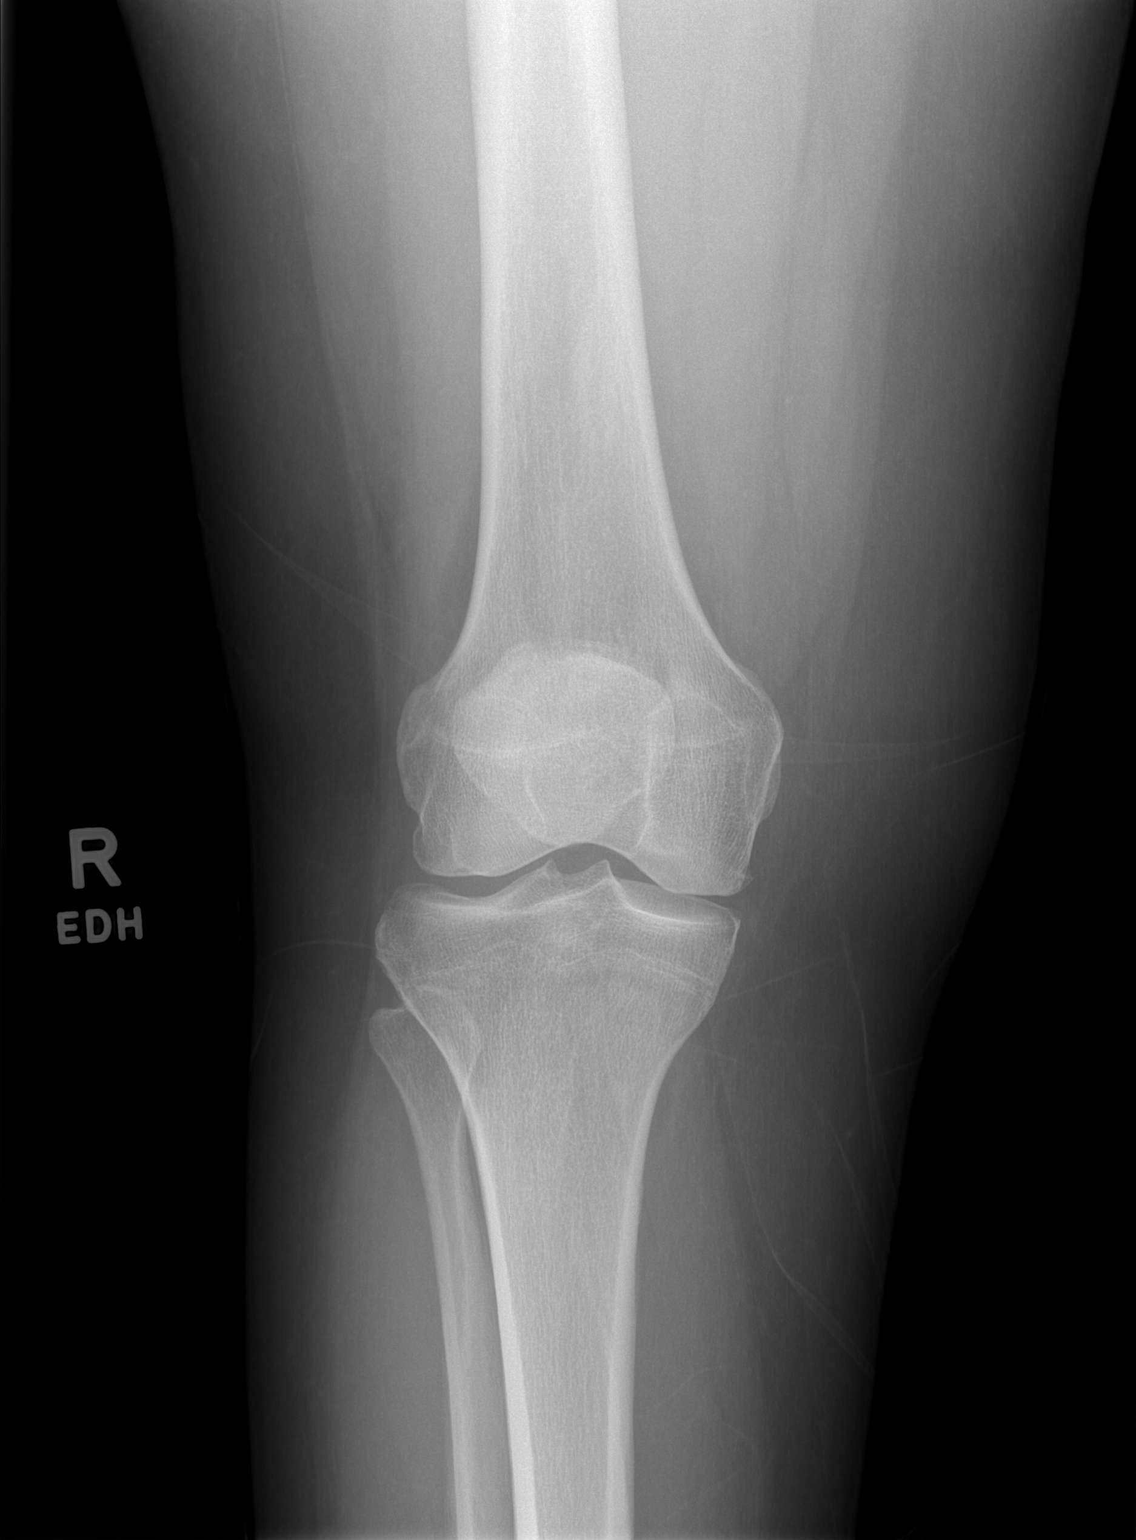

[t knee lat right]
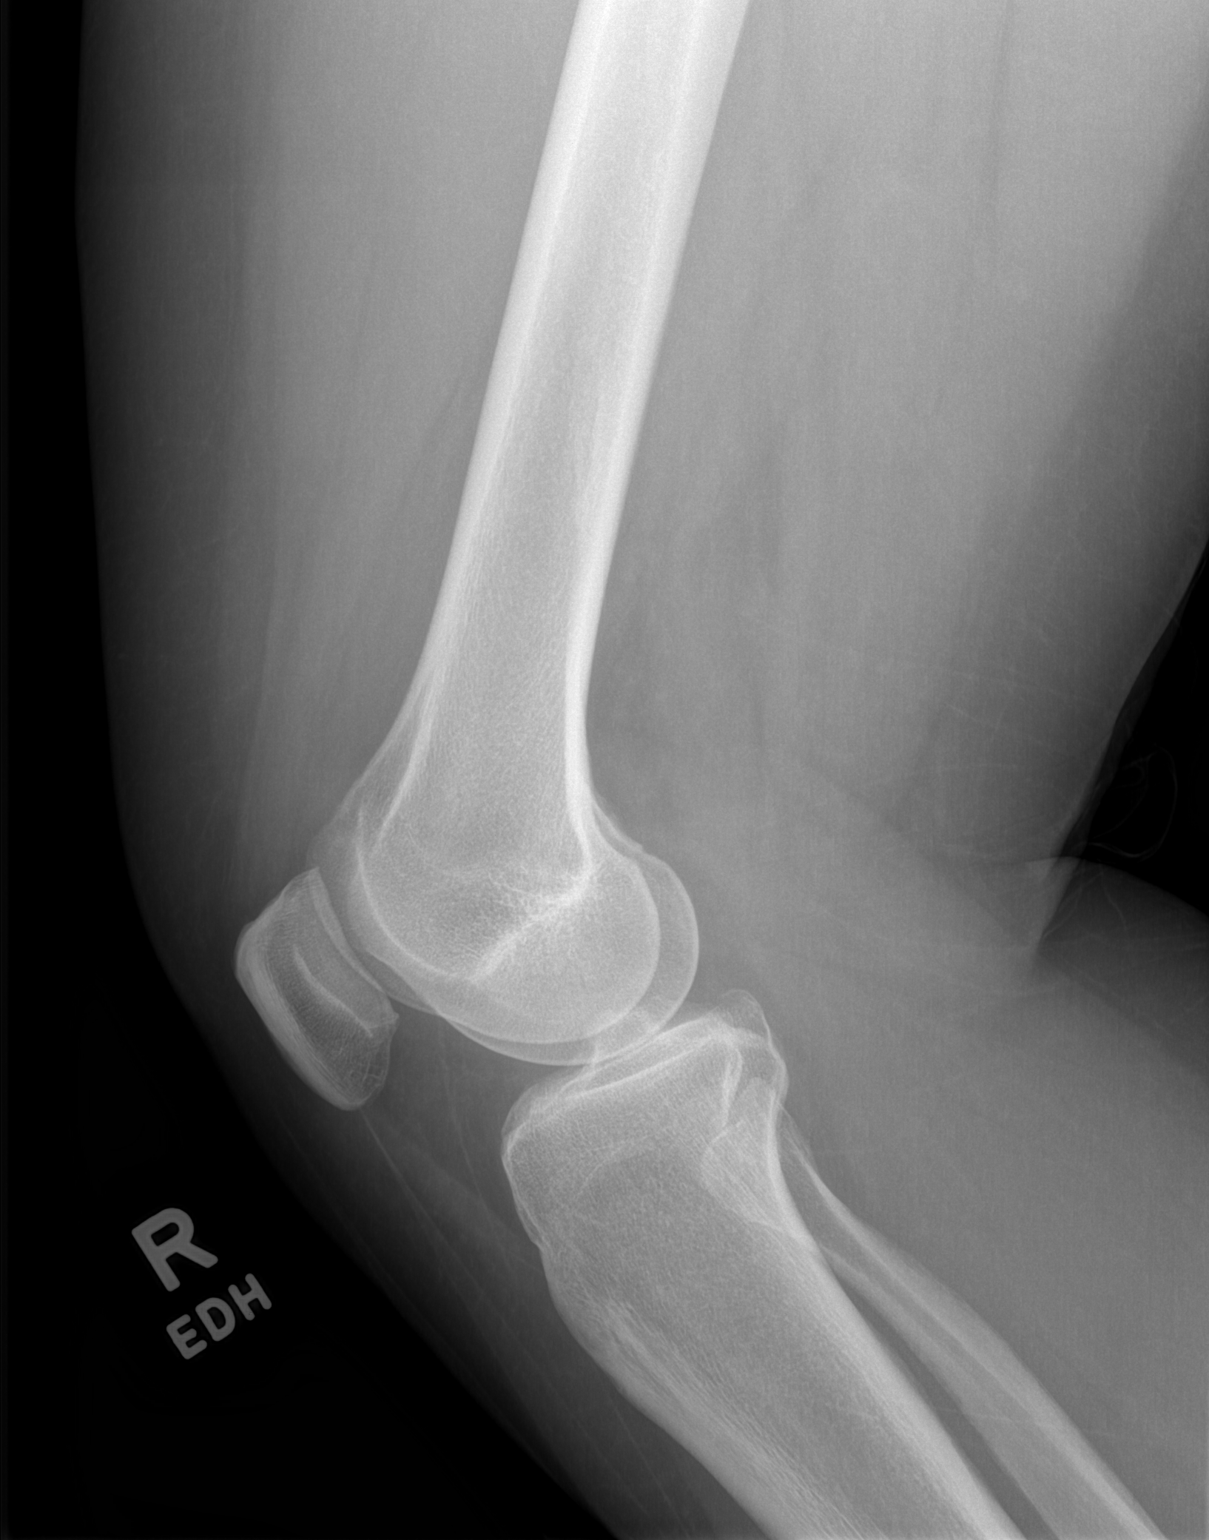

[2 of 2 positions shown; findings below may reference images not displayed]

FINDINGS: Frontal and lateral views of the right knee demonstrate no fracture,
subluxation, or dislocation. Minimal spurring of the medial and
patellofemoral compartments. No joint effusion.
IMPRESSION: 1. Mild medial and patellofemoral compartmental osteoarthritis.

## 2021-08-18 ENCOUNTER — Encounter (HOSPITAL_BASED_OUTPATIENT_CLINIC_OR_DEPARTMENT_OTHER): Payer: Self-pay | Admitting: Orthopaedic Surgery

## 2021-08-20 ENCOUNTER — Encounter (HOSPITAL_BASED_OUTPATIENT_CLINIC_OR_DEPARTMENT_OTHER)
Admission: RE | Admit: 2021-08-20 | Discharge: 2021-08-20 | Disposition: A | Discharge status: Home / Self Care | Payer: 59 | Source: Ambulatory Visit | Attending: Orthopaedic Surgery | Admitting: Orthopaedic Surgery

## 2021-08-20 DIAGNOSIS — I10 Essential (primary) hypertension: Secondary | ICD-10-CM | POA: Insufficient documentation

## 2021-08-20 DIAGNOSIS — Z01818 Encounter for other preprocedural examination: Secondary | ICD-10-CM | POA: Diagnosis present

## 2021-08-20 LAB — BASIC METABOLIC PANEL
Anion gap: 7 (ref 5–15)
BUN: 9 mg/dL (ref 6–20)
CO2: 28 mmol/L (ref 22–32)
Calcium: 8.9 mg/dL (ref 8.9–10.3)
Chloride: 101 mmol/L (ref 98–111)
Creatinine, Ser: 0.69 mg/dL (ref 0.44–1.00)
GFR, Estimated: >60 mL/min (ref >60)
Glucose, Bld: 87 mg/dL (ref 70–99)
Potassium: 4.2 mmol/L (ref 3.5–5.1)
Sodium: 136 mmol/L (ref 135–145)

## 2021-08-25 NOTE — H&P (Signed)
PREOPERATIVE H&P  Chief Complaint: right knee meniscus injury  HPI: Victoria Sampson is a 50 y.o. female who is scheduled for, Procedure(s): KNEE ARTHROSCOPY WITH MEDIAL MENISECTOMY.   Patient has a past medical history significant for HTN and hypothyroidism.   The patient is a 50 year old who we have been seeing for right knee pain.  She was last seen back on September 9th and treated conservatively with an injection, J&J brace and physical therapy.  She notes near full relief after the injection and physical therapy; however, the pain has returned over the last few days.  She notes it started to bother her when she was doing squats at physical therapy.  She is having some catching in the right knee.    Her symptoms are rated as moderate to severe, and have been worsening.  This is significantly impairing activities of daily living.    Please see clinic note for further details on this patient's care.    She has elected for surgical management.   Past Medical History:  Diagnosis Date   Allergic rhinitis    Allergy    Food allergy    Hypertension    Hypothyroidism    Obesity    Past Surgical History:  Procedure Laterality Date   CESAREAN SECTION     TUBAL LIGATION     Social History   Socioeconomic History   Marital status: Married    Spouse name: Not on file   Number of children: Not on file   Years of education: Not on file   Highest education level: Not on file  Occupational History   Not on file  Tobacco Use   Smoking status: Never   Smokeless tobacco: Never  Substance and Sexual Activity   Alcohol use: No   Drug use: No   Sexual activity: Yes  Other Topics Concern   Not on file  Social History Narrative   Not on file   Social Determinants of Health   Financial Resource Strain: Not on file  Food Insecurity: Not on file  Transportation Needs: Not on file  Physical Activity: Not on file  Stress: Not on file  Social Connections: Not on file    History reviewed. No pertinent family history. Allergies  Allergen Reactions   Asa [Aspirin] Hives    Hives    Benadryl [Diphenhydramine] Swelling    Facial swelling   Eggs Or Egg-Derived Products Hives    Specifically egg whites   Garlic Swelling    Face swelling    Lemon Oil Swelling    Face swelling   Lentil Other (See Comments)    Reaction unknown   Macrobid [Nitrofurantoin Macrocrystal] Swelling    Swelling in lips    Peanut-Containing Drug Products Swelling    Facial swelling with all nuts and nut products   Shrimp [Shellfish Allergy] Swelling    Swelling    Tylenol [Acetaminophen] Hives   Prior to Admission medications   Medication Sig Start Date End Date Taking? Authorizing Provider  amLODipine (NORVASC) 10 MG tablet Take 10 mg by mouth daily.   Yes [provider]  levothyroxine (SYNTHROID) 50 MCG tablet Take 50 mcg by mouth daily before breakfast.   Yes [provider]  Cholecalciferol (VITAMIN D3) 50000 units CAPS Take 1 capsule by mouth once a week. On Sundays 05/05/17   [provider]  EPINEPHrine (EPIPEN 2-PAK) 0.3 mg/0.3 mL IJ SOAJ injection Inject 0.3 mLs (0.3 mg total) into the muscle as needed (for  allergic reaction). 06/05/17   Linwood Dibbles, MD  fluticasone (FLONASE) 50 MCG/ACT nasal spray Place 1 spray into both nostrils 2 (two) times daily as needed for allergies.  02/01/17   [provider]  levocetirizine (XYZAL) 5 MG tablet Take 5 mg by mouth every evening.    [provider]  spironolactone (ALDACTONE) 25 MG tablet Take 25 mg by mouth 2 (two) times daily.     [provider]    ROS: All other systems have been reviewed and were otherwise negative with the exception of those mentioned in the HPI and as above.  Physical Exam: General: Alert, no acute distress Cardiovascular: No pedal edema Respiratory: No cyanosis, no use of accessory musculature GI: No organomegaly, abdomen is soft and  non-tender Skin: No lesions in the area of chief complaint Neurologic: Sensation intact distally Psychiatric: Patient is competent for consent with normal mood and affect Lymphatic: No axillary or cervical lymphadenopathy  MUSCULOSKELETAL:  On examination of the patient's right knee there is exquisite tenderness at the medial joint line.  She has some pes bursa tenderness as well.  Range of motion from 0-120 degrees.  She has no pain with varus or valgus stress testing.  Positive flexion pinch.  Distal motor and sensory functions are intact.    Imaging: MRI demonstrates a complex medial meniscus tear. She has some tri-compartmental changes in the form of arthritis.  Assessment: right knee meniscus injury  Plan: Plan for Procedure(s): KNEE ARTHROSCOPY WITH MEDIAL MENISECTOMY  The risks benefits and alternatives were discussed with the patient including but not limited to the risks of nonoperative treatment, versus surgical intervention including infection, bleeding, nerve injury,  blood clots, cardiopulmonary complications, morbidity, mortality, among others, and they were willing to proceed.   The patient acknowledged the explanation, agreed to proceed with the plan and consent was signed.   Operative Plan: Right knee scope with medial meniscectomy  Discharge Medications: Standard DVT Prophylaxis: Aspirin Physical Therapy: Outpatient PT Special Discharge needs: +/-   Vernetta Honey, PA-C  08/25/2021 10:54 AM

## 2021-08-27 ENCOUNTER — Ambulatory Visit (HOSPITAL_BASED_OUTPATIENT_CLINIC_OR_DEPARTMENT_OTHER): Payer: 59 | Admitting: Certified Registered"

## 2021-08-27 ENCOUNTER — Encounter (HOSPITAL_BASED_OUTPATIENT_CLINIC_OR_DEPARTMENT_OTHER)
Admission: RE | Disposition: A | Discharge status: Home / Self Care | Payer: Self-pay | Source: Ambulatory Visit | Attending: Orthopaedic Surgery

## 2021-08-27 ENCOUNTER — Ambulatory Visit (HOSPITAL_BASED_OUTPATIENT_CLINIC_OR_DEPARTMENT_OTHER)
Admission: RE | Admit: 2021-08-27 | Discharge: 2021-08-27 | Disposition: A | Discharge status: Home / Self Care | Payer: 59 | Source: Ambulatory Visit | Attending: Orthopaedic Surgery | Admitting: Orthopaedic Surgery

## 2021-08-27 ENCOUNTER — Other Ambulatory Visit: Payer: Self-pay

## 2021-08-27 ENCOUNTER — Encounter (HOSPITAL_BASED_OUTPATIENT_CLINIC_OR_DEPARTMENT_OTHER): Payer: Self-pay | Admitting: Orthopaedic Surgery

## 2021-08-27 DIAGNOSIS — M2341 Loose body in knee, right knee: Secondary | ICD-10-CM | POA: Diagnosis not present

## 2021-08-27 DIAGNOSIS — I10 Essential (primary) hypertension: Secondary | ICD-10-CM | POA: Insufficient documentation

## 2021-08-27 DIAGNOSIS — E039 Hypothyroidism, unspecified: Secondary | ICD-10-CM | POA: Insufficient documentation

## 2021-08-27 DIAGNOSIS — Z6839 Body mass index (BMI) 39.0-39.9, adult: Secondary | ICD-10-CM | POA: Insufficient documentation

## 2021-08-27 DIAGNOSIS — X58XXXA Exposure to other specified factors, initial encounter: Secondary | ICD-10-CM | POA: Insufficient documentation

## 2021-08-27 DIAGNOSIS — S83231A Complex tear of medial meniscus, current injury, right knee, initial encounter: Secondary | ICD-10-CM | POA: Diagnosis not present

## 2021-08-27 DIAGNOSIS — Z79899 Other long term (current) drug therapy: Secondary | ICD-10-CM | POA: Insufficient documentation

## 2021-08-27 DIAGNOSIS — M1711 Unilateral primary osteoarthritis, right knee: Secondary | ICD-10-CM | POA: Diagnosis not present

## 2021-08-27 HISTORY — PX: KNEE ARTHROSCOPY WITH MEDIAL MENISECTOMY: SHX5651

## 2021-08-27 LAB — POCT PREGNANCY, URINE: Preg Test, Ur: NEGATIVE

## 2021-08-27 SURGERY — ARTHROSCOPY, KNEE, WITH MEDIAL MENISCECTOMY
Anesthesia: General | Site: Knee | Laterality: Right

## 2021-08-27 MED ORDER — FENTANYL CITRATE (PF) 100 MCG/2ML IJ SOLN
INTRAMUSCULAR | Status: DC | PRN
  Administered 2021-08-27: 50 ug via INTRAVENOUS

## 2021-08-27 MED ORDER — PROPOFOL 10 MG/ML IV BOLUS
INTRAVENOUS | Status: AC
  Filled 2021-08-27: qty 20

## 2021-08-27 MED ORDER — ONDANSETRON HCL 4 MG/2ML IJ SOLN
INTRAMUSCULAR | Status: DC | PRN
  Administered 2021-08-27: 4 mg via INTRAVENOUS

## 2021-08-27 MED ORDER — LIDOCAINE 2% (20 MG/ML) 5 ML SYRINGE
INTRAMUSCULAR | Status: DC | PRN
  Administered 2021-08-27: 80 mg via INTRAVENOUS

## 2021-08-27 MED ORDER — CELECOXIB 100 MG PO CAPS: 100.0000 mg | ORAL_CAPSULE | Freq: Two times a day (BID) | ORAL | 0 refills | Status: AC

## 2021-08-27 MED ORDER — DEXAMETHASONE SODIUM PHOSPHATE 10 MG/ML IJ SOLN
INTRAMUSCULAR | Status: DC | PRN
  Administered 2021-08-27: 10 mg via INTRAVENOUS

## 2021-08-27 MED ORDER — CEFAZOLIN SODIUM-DEXTROSE 2-4 GM/100ML-% IV SOLN
2.0000 g | INTRAVENOUS | Status: AC
  Administered 2021-08-27: 09:00:00 2 g via INTRAVENOUS

## 2021-08-27 MED ORDER — KETOROLAC TROMETHAMINE 30 MG/ML IJ SOLN
INTRAMUSCULAR | Status: AC
  Filled 2021-08-27: qty 1

## 2021-08-27 MED ORDER — MIDAZOLAM HCL 2 MG/2ML IJ SOLN
INTRAMUSCULAR | Status: AC
  Filled 2021-08-27: qty 2

## 2021-08-27 MED ORDER — LIDOCAINE 2% (20 MG/ML) 5 ML SYRINGE
INTRAMUSCULAR | Status: AC
  Filled 2021-08-27: qty 5

## 2021-08-27 MED ORDER — MIDAZOLAM HCL 5 MG/5ML IJ SOLN
INTRAMUSCULAR | Status: DC | PRN
  Administered 2021-08-27: 2 mg via INTRAVENOUS

## 2021-08-27 MED ORDER — OXYCODONE HCL 5 MG PO TABS
ORAL_TABLET | ORAL | Status: AC
  Filled 2021-08-27: qty 1

## 2021-08-27 MED ORDER — PHENYLEPHRINE 40 MCG/ML (10ML) SYRINGE FOR IV PUSH (FOR BLOOD PRESSURE SUPPORT)
PREFILLED_SYRINGE | INTRAVENOUS | Status: DC | PRN
  Administered 2021-08-27: 200 ug via INTRAVENOUS

## 2021-08-27 MED ORDER — ONDANSETRON HCL 4 MG PO TABS: 4.0000 mg | ORAL_TABLET | Freq: Three times a day (TID) | ORAL | 0 refills | Status: AC | PRN

## 2021-08-27 MED ORDER — SODIUM CHLORIDE 0.9 % IR SOLN
Status: DC | PRN
  Administered 2021-08-27: 3000 mL

## 2021-08-27 MED ORDER — OXYCODONE HCL 5 MG PO TABS
5.0000 mg | ORAL_TABLET | Freq: Once | ORAL | Status: AC
  Administered 2021-08-27: 10:00:00 5 mg via ORAL

## 2021-08-27 MED ORDER — BUPIVACAINE HCL (PF) 0.25 % IJ SOLN
INTRAMUSCULAR | Status: DC | PRN
  Administered 2021-08-27: 20 mL

## 2021-08-27 MED ORDER — LACTATED RINGERS IV SOLN: INTRAVENOUS | Status: DC

## 2021-08-27 MED ORDER — OXYCODONE HCL 5 MG PO TABS: ORAL_TABLET | ORAL | 0 refills | Status: AC

## 2021-08-27 MED ORDER — FENTANYL CITRATE (PF) 100 MCG/2ML IJ SOLN
25.0000 ug | INTRAMUSCULAR | Status: DC | PRN
  Administered 2021-08-27 (×2): 50 ug via INTRAVENOUS

## 2021-08-27 MED ORDER — ONDANSETRON HCL 4 MG/2ML IJ SOLN
INTRAMUSCULAR | Status: AC
  Filled 2021-08-27: qty 2

## 2021-08-27 MED ORDER — KETOROLAC TROMETHAMINE 30 MG/ML IJ SOLN
INTRAMUSCULAR | Status: DC | PRN
  Administered 2021-08-27: 30 mg via INTRAVENOUS

## 2021-08-27 MED ORDER — CEFAZOLIN SODIUM-DEXTROSE 2-4 GM/100ML-% IV SOLN
INTRAVENOUS | Status: AC
  Filled 2021-08-27: qty 100

## 2021-08-27 MED ORDER — FENTANYL CITRATE (PF) 100 MCG/2ML IJ SOLN
INTRAMUSCULAR | Status: AC
  Filled 2021-08-27: qty 2

## 2021-08-27 MED ORDER — PROPOFOL 10 MG/ML IV BOLUS
INTRAVENOUS | Status: DC | PRN
  Administered 2021-08-27: 200 mg via INTRAVENOUS

## 2021-08-27 SURGICAL SUPPLY — 33 items
APL PRP STRL LF DISP 70% ISPRP (MISCELLANEOUS) ×1
BANDAGE ESMARK 6X9 LF (GAUZE/BANDAGES/DRESSINGS) IMPLANT
BNDG CMPR 9X6 STRL LF SNTH (GAUZE/BANDAGES/DRESSINGS)
BNDG ELASTIC 6X5.8 VLCR STR LF (GAUZE/BANDAGES/DRESSINGS) ×3 IMPLANT
BNDG ESMARK 6X9 LF (GAUZE/BANDAGES/DRESSINGS)
CHLORAPREP W/TINT 26 (MISCELLANEOUS) ×3 IMPLANT
CLSR STERI-STRIP ANTIMIC 1/2X4 (GAUZE/BANDAGES/DRESSINGS) ×3 IMPLANT
CUFF TOURN SGL QUICK 34 (TOURNIQUET CUFF)
CUFF TRNQT CYL 34X4.125X (TOURNIQUET CUFF) ×2 IMPLANT
DISSECTOR 4.0MMX13CM CVD (MISCELLANEOUS) ×3 IMPLANT
DRAPE ARTHROSCOPY W/POUCH 90 (DRAPES) ×3 IMPLANT
DRAPE IMP U-DRAPE 54X76 (DRAPES) ×3 IMPLANT
DRAPE U-SHAPE 47X51 STRL (DRAPES) ×3 IMPLANT
GAUZE SPONGE 4X4 12PLY STRL (GAUZE/BANDAGES/DRESSINGS) ×3 IMPLANT
GLOVE SRG 8 PF TXTR STRL LF DI (GLOVE) ×2 IMPLANT
GLOVE SURG ENC MOIS LTX SZ6.5 (GLOVE) ×5 IMPLANT
GLOVE SURG LTX SZ8 (GLOVE) ×6 IMPLANT
GLOVE SURG UNDER POLY LF SZ6.5 (GLOVE) ×4 IMPLANT
GLOVE SURG UNDER POLY LF SZ8 (GLOVE) ×2
GOWN STRL REUS W/ TWL LRG LVL3 (GOWN DISPOSABLE) ×2 IMPLANT
GOWN STRL REUS W/TWL LRG LVL3 (GOWN DISPOSABLE) ×4
GOWN STRL REUS W/TWL XL LVL3 (GOWN DISPOSABLE) ×3 IMPLANT
KIT TURNOVER KIT B (KITS) ×3 IMPLANT
MANIFOLD NEPTUNE II (INSTRUMENTS) IMPLANT
NS IRRIG 1000ML POUR BTL (IV SOLUTION) IMPLANT
PACK ARTHROSCOPY DSU (CUSTOM PROCEDURE TRAY) ×3 IMPLANT
PORT APPOLLO RF 90DEGREE MULTI (SURGICAL WAND) IMPLANT
SLEEVE SCD COMPRESS KNEE MED (STOCKING) ×3 IMPLANT
SUT MNCRL AB 4-0 PS2 18 (SUTURE) ×3 IMPLANT
TOWEL GREEN STERILE FF (TOWEL DISPOSABLE) ×3 IMPLANT
TUBE CONNECTING 20X1/4 (TUBING) ×3 IMPLANT
TUBING ARTHROSCOPY IRRIG 16FT (MISCELLANEOUS) ×3 IMPLANT
WATER STERILE IRR 1000ML POUR (IV SOLUTION) ×3 IMPLANT

## 2021-08-27 NOTE — Transfer of Care (Signed)
Immediate Anesthesia Transfer of Care Note  Patient: Victoria Sampson  Procedure(s) Performed: KNEE ARTHROSCOPY WITH MEDIAL MENISECTOMY (Right: Knee)  Patient Location: PACU  Anesthesia Type:General  Level of Consciousness: drowsy  Airway & Oxygen Therapy: Patient Spontanous Breathing and Patient connected to face mask oxygen  Post-op Assessment: Report given to RN and Post -op Vital signs reviewed and stable  Post vital signs: Reviewed and stable  Last Vitals:  Vitals Value Taken Time  BP 125/76 08/27/21 0930  Temp    Pulse 72 08/27/21 0930  Resp 14 08/27/21 0930  SpO2 100 % 08/27/21 0930  Vitals shown include unvalidated device data.  Last Pain:  Vitals:   08/27/21 0752  TempSrc: Oral  PainSc: 0-No pain      Patients Stated Pain Goal: 4 (08/27/21 0752)  Complications: No notable events documented.

## 2021-08-27 NOTE — Discharge Instructions (Addendum)
Ramond Marrow MD, MPH Alfonse Alpers, PA-C [facility] [address], [address] [phone] (tel)   [phone] (fax)   POST-OPERATIVE INSTRUCTIONS - Knee Arthroscopy  WOUND CARE - You may remove the Operative Dressing on Post-Op Day #3 (72hrs after surgery).   -  Alternatively if you would like you can leave dressing on until follow-up if within 7-8 days but keep it dry. - Leave steri-strips in place until they fall off on their own, usually 2 weeks postop. - An ACE wrap may be used to control swelling, do not wrap this too tight.  If the initial ACE wrap feels too tight you may loosen it. - There may be a small amount of fluid/bleeding leaking at the surgical site.  - This is normal; the knee is filled with fluid during the procedure and can leak for 24-48hrs after surgery. You may change/reinforce the bandage as needed.  - Use the Cryocuff or Ice as often as possible for the first 7 days, then as needed for pain relief. Always keep a towel, ACE wrap or other barrier between the cooling unit and your skin.  - You may shower on Post-Op Day #3. Gently pat the area dry. Do not soak the knee in water or submerge it.  - Do not go swimming in the pool or ocean until 4 weeks after surgery or when otherwise instructed.  Keep incisions as dry as possible.   BRACE/AMBULATION -  You will not need a brace after this procedure.   - You may use crutches initially to help you ambulate, but this is not required - You can put full weight on your operative leg as your tolerate - Please continue to ambulate and do not stay sitting or lying for too long.  - Perform foot and wrist pumps to assist in circulation.  - You have a physical therapy appointment scheduled on Tuesday, January 3rd @ 8am  REGIONAL ANESTHESIA (NERVE BLOCKS) - The anesthesia team may have performed a nerve block for you if safe in the setting of your care.  This is a great tool used to minimize pain.  Typically  the block may start wearing off overnight.  This can be a challenging period but please utilize your as needed pain medications to try and manage this period and know it will be a brief transition as the nerve block wears completely   POST-OP MEDICATIONS - Multimodal approach to pain control - In general your pain will be controlled with a combination of substances.  Prescriptions unless otherwise discussed are electronically sent to your pharmacy.  This is a carefully made plan we use to minimize narcotic use.     - Celebrex - Anti-inflammatory medication taken on a scheduled basis - Oxycodone - This is a strong narcotic, to be used only on an "as needed" basis for SEVERE pain. -  Zofran - take as needed for nausea   FOLLOW-UP   Please call the office to schedule a follow-up appointment for your incision check, 7-10 days post-operatively.   IF YOU HAVE ANY QUESTIONS, PLEASE FEEL FREE TO CALL OUR OFFICE.   HELPFUL INFORMATION  - If you had a block, it will wear off between 8-24 hrs postop typically.  This is period when your pain may go from nearly zero to the pain you would have had post-op without the block.  This is an abrupt transition but nothing dangerous is happening.  You may take an extra dose of narcotic when  this happens.   Keep your leg elevated to decrease swelling, which will then in turn decrease your pain. I would elevate the foot of your bed by putting a couple of couch pillows between your mattress and box spring. I would not keep pillow directly under your ankle.  - Do not sleep with a pillow behind your knee even if it is more comfortable as this may make it harder to get your knee fully straight long term.   There will be MORE swelling on days 1-3 than there is on the day of surgery.  This also is normal. The swelling will decrease with the anti-inflammatory medication, ice and keeping it elevated. The swelling will make it more difficult to bend your knee. As the  swelling goes down your motion will become easier   You may develop swelling and bruising that extends from your knee down to your calf and perhaps even to your foot over the next week. Do not be alarmed. This too is normal, and it is due to gravity   There may be some numbness adjacent to the incision site. This may last for 6-12 months or longer in some patients and is expected.   You may return to sedentary work/school in the next couple of days when you feel up to it. You will need to keep your leg elevated as much as possible    You should wean off your narcotic medicines as soon as you are able.  Most patients will be off or using minimal narcotics before their first postop appointment.    We suggest you use the pain medication the first night prior to going to bed, in order to ease any pain when the anesthesia wears off. You should avoid taking pain medications on an empty stomach as it will make you nauseous.   Do not drink alcoholic beverages or take illicit drugs when taking pain medications.   It is against the law to drive while taking narcotics. You cannot drive if your Right leg is in brace locked in extension.   Pain medication may make you constipated.  Below are a few solutions to try in this order:  o Decrease the amount of pain medication if you aren't having pain.  o Drink lots of decaffeinated fluids.  o Drink prune juice and/or eat dried prunes   o If the first 3 don't work start with additional solutions  o Take Colace - an over-the-counter stool softener  o Take Senokot - an over-the-counter laxative  o Take Miralax - a stronger over-the-counter laxative    For more information including helpful videos and documents visit our website:   [email or web address]    Post Anesthesia Home Care Instructions  Activity: Get plenty of rest for the remainder of the day. A responsible individual must stay with you for 24 hours  following the procedure.  For the next 24 hours, DO NOT: -Drive a car -Advertising copywriter -Drink alcoholic beverages -Take any medication unless instructed by your physician -Make any legal decisions or sign important papers.  Meals: Start with liquid foods such as gelatin or soup. Progress to regular foods as tolerated. Avoid greasy, spicy, heavy foods. If nausea and/or vomiting occur, drink only clear liquids until the nausea and/or vomiting subsides. Call your physician if vomiting continues.  Special Instructions/Symptoms: Your throat may feel dry or sore from the anesthesia or the breathing tube placed in your throat during surgery. If this causes discomfort, gargle with warm salt water. The discomfort  should disappear within 24 hours.  If you had a scopolamine patch placed behind your ear for the management of post- operative nausea and/or vomiting:  1. The medication in the patch is effective for 72 hours, after which it should be removed.  Wrap patch in a tissue and discard in the trash. Wash hands thoroughly with soap and water. 2. You may remove the patch earlier than 72 hours if you experience unpleasant side effects which may include dry mouth, dizziness or visual disturbances. 3. Avoid touching the patch. Wash your hands with soap and water after contact with the patch.      No Ibuprofen until after 3:15pm today. You had Oxycodone 5mg  at 1015 and may have it again after2:15pm today, if needed.

## 2021-08-27 NOTE — Interval H&P Note (Signed)
All questions answered, patient wants to proceed with procedure. ? ?

## 2021-08-27 NOTE — Anesthesia Procedure Notes (Signed)
Procedure Name: LMA Insertion Date/Time: 08/27/2021 8:57 AM Performed by: Marny Lowenstein, CRNA Pre-anesthesia Checklist: Patient identified, Emergency Drugs available, Suction available and Patient being monitored Patient Re-evaluated:Patient Re-evaluated prior to induction Oxygen Delivery Method: Circle system utilized Preoxygenation: Pre-oxygenation with 100% oxygen Induction Type: IV induction Ventilation: Mask ventilation without difficulty and Oral airway inserted - appropriate to patient size LMA: LMA inserted LMA Size: 4.0 Number of attempts: 1 Placement Confirmation: positive ETCO2 and breath sounds checked- equal and bilateral Tube secured with: Tape Dental Injury: Teeth and Oropharynx as per pre-operative assessment

## 2021-08-27 NOTE — Anesthesia Preprocedure Evaluation (Addendum)
Anesthesia Evaluation  Patient identified by MRN, date of birth, ID band Patient awake    Reviewed: Allergy & Precautions, NPO status , Patient's Chart, lab work & pertinent test results  Airway Mallampati: III  TM Distance: >3 FB Neck ROM: Full    Dental no notable dental hx. (+) Teeth Intact, Dental Advisory Given   Pulmonary neg pulmonary ROS,    Pulmonary exam normal breath sounds clear to auscultation       Cardiovascular hypertension, Pt. on medications Normal cardiovascular exam Rhythm:Regular Rate:Normal     Neuro/Psych negative neurological ROS  negative psych ROS   GI/Hepatic negative GI ROS, Neg liver ROS,   Endo/Other  Hypothyroidism Morbid obesity (BMI 40)  Renal/GU negative Renal ROS  negative genitourinary   Musculoskeletal negative musculoskeletal ROS (+)   Abdominal   Peds  Hematology negative hematology ROS (+)   Anesthesia Other Findings   Reproductive/Obstetrics                            Anesthesia Physical Anesthesia Plan  ASA: 2  Anesthesia Plan: General   Post-op Pain Management:    Induction: Intravenous  PONV Risk Score and Plan: 3 and Ondansetron, Dexamethasone and Midazolam  Airway Management Planned: LMA  Additional Equipment:   Intra-op Plan:   Post-operative Plan: Extubation in OR  Informed Consent: I have reviewed the patients History and Physical, chart, labs and discussed the procedure including the risks, benefits and alternatives for the proposed anesthesia with the patient or authorized representative who has indicated his/her understanding and acceptance.     Dental advisory given  Plan Discussed with: CRNA  Anesthesia Plan Comments:         Anesthesia Quick Evaluation

## 2021-08-27 NOTE — Op Note (Signed)
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  Posterior medial root type tear that was complex in nature and not amenable to repair. Lateral Compartment: Normal Intercondylar Notch: Normal  Successful completion of the planned procedure.  Multiple small cartilaginous loose bodies removed.  Patient fails the surgery may be a candidate for unicompartmental arthroplasty  Post-operative plan: The patient will be weightbearing to tolerance.  The patient will be discharged home.  DVT prophylaxis discussed with the patient and due to her aspirin allergy we felt that early mobilization be appropriate.  The patient understood that she is a higher risk than the standard patient of clot and she will alert [place] if she is not mobilizing well we will send Xarelto or Eliquis.  Pain control with PRN pain medication preferring oral medicines.  Follow up plan will be scheduled in approximately 7 days for incision check.  Post-Op Diagnosis: Same Surgeons:Primary: Bjorn Pippin, MD Assistants:Caroline McBane PA-C Location: [facility] OR ROOM 6 Anesthesia: General with local anesthetic Antibiotics: Ancef 2 g Tourniquet time: * No tourniquets in log * Estimated Blood Loss: Minimal Complications: None Specimens: None Implants: * No implants in log *  Indications for Surgery:   Victoria Sampson is a 50 y.o. female with medial wear and medial mechanical  symptoms.  Benefits and risks of operative and nonoperative management were discussed prior to surgery with patient/guardian(s) and informed consent form was completed.  Specific risks including infection, need for additional surgery, postoperative arthrosis, continued pain amongst others   Procedure:   The patient was identified properly. Informed consent was obtained and the surgical site was marked. The patient was taken up to suite where general anesthesia was induced. The patient was placed in the supine position with a post against the surgical leg and a nonsterile tourniquet applied. The surgical leg was then prepped and draped usual sterile fashion.  A standard surgical timeout was performed.  2 standard anterior portals were made and diagnostic arthroscopy performed. Please note the findings as noted above.  Used a shaver and a basket debride the posterior medial meniscus back to a stable base.  30% total meniscal volume resected.  Multiple loose bodies 1 x 1 cm were removed using a shaver primarily.  Incisions closed with absorbable suture. The patient was awoken from general anesthesia and taken to the PACU in stable condition without complication.   Alfonse Alpers, PA-C, present and scrubbed throughout the case, critical for completion in a timely fashion, and for retraction, instrumentation, closure.

## 2021-08-27 NOTE — Anesthesia Postprocedure Evaluation (Signed)
Anesthesia Post Note  Patient: Victoria Sampson  Procedure(s) Performed: KNEE ARTHROSCOPY WITH MEDIAL MENISECTOMY (Right: Knee)     Patient location during evaluation: PACU Anesthesia Type: General Level of consciousness: awake and alert Pain management: pain level controlled Vital Signs Assessment: post-procedure vital signs reviewed and stable Respiratory status: spontaneous breathing, nonlabored ventilation, respiratory function stable and patient connected to nasal cannula oxygen Cardiovascular status: blood pressure returned to baseline and stable Postop Assessment: no apparent nausea or vomiting Anesthetic complications: no   No notable events documented.  Last Vitals:  Vitals:   08/27/21 1000 08/27/21 1018  BP: 107/64 136/67  Pulse: (!) 53 63  Resp: (!) 8 12  Temp:  36.8 C  SpO2: 100% 100%    Last Pain:  Vitals:   08/27/21 1011  TempSrc:   PainSc: 3                  Severino Paolo L Keilly Fatula

## 2021-09-01 ENCOUNTER — Encounter (HOSPITAL_BASED_OUTPATIENT_CLINIC_OR_DEPARTMENT_OTHER): Payer: Self-pay | Admitting: Orthopaedic Surgery

## 2024-04-23 ENCOUNTER — Emergency Department (HOSPITAL_COMMUNITY)
Admission: EM | Admit: 2024-04-23 | Discharge: 2024-04-23 | Disposition: A | Discharge status: Home / Self Care | Attending: Emergency Medicine | Admitting: Emergency Medicine

## 2024-04-23 ENCOUNTER — Encounter (HOSPITAL_COMMUNITY): Payer: Self-pay

## 2024-04-23 ENCOUNTER — Other Ambulatory Visit: Payer: Self-pay

## 2024-04-23 ENCOUNTER — Emergency Department (HOSPITAL_COMMUNITY)

## 2024-04-23 DIAGNOSIS — M25512 Pain in left shoulder: Secondary | ICD-10-CM | POA: Diagnosis not present

## 2024-04-23 DIAGNOSIS — R079 Chest pain, unspecified: Secondary | ICD-10-CM | POA: Diagnosis present

## 2024-04-23 DIAGNOSIS — R7989 Other specified abnormal findings of blood chemistry: Secondary | ICD-10-CM | POA: Diagnosis not present

## 2024-04-23 LAB — CBC
HCT: 46.7 % — ABNORMAL HIGH (ref 36.0–46.0)
Hemoglobin: 14.8 g/dL (ref 12.0–15.0)
MCH: 28.8 pg (ref 26.0–34.0)
MCHC: 31.7 g/dL (ref 30.0–36.0)
MCV: 91 fL (ref 80.0–100.0)
Platelets: 253 K/uL (ref 150–400)
RBC: 5.13 MIL/uL — ABNORMAL HIGH (ref 3.87–5.11)
RDW: 13.5 % (ref 11.5–15.5)
WBC: 6 K/uL (ref 4.0–10.5)
nRBC: 0 % (ref 0.0–0.2)

## 2024-04-23 LAB — TROPONIN I (HIGH SENSITIVITY)
Troponin I (High Sensitivity): 3 ng/L (ref <18)
Troponin I (High Sensitivity): 4 ng/L (ref <18)

## 2024-04-23 LAB — BASIC METABOLIC PANEL WITH GFR
Anion gap: 10 (ref 5–15)
BUN: 11 mg/dL (ref 6–20)
CO2: 23 mmol/L (ref 22–32)
Calcium: 9.3 mg/dL (ref 8.9–10.3)
Chloride: 104 mmol/L (ref 98–111)
Creatinine, Ser: 0.7 mg/dL (ref 0.44–1.00)
GFR, Estimated: >60 mL/min (ref >60)
Glucose, Bld: 99 mg/dL (ref 70–99)
Potassium: 4.1 mmol/L (ref 3.5–5.1)
Sodium: 137 mmol/L (ref 135–145)

## 2024-04-23 LAB — HCG, SERUM, QUALITATIVE: Preg, Serum: NEGATIVE

## 2024-04-23 LAB — D-DIMER, QUANTITATIVE: D-Dimer, Quant: 0.63 ug{FEU}/mL — ABNORMAL HIGH (ref 0.00–0.50)

## 2024-04-23 MED ORDER — LIDOCAINE 5 % EX PTCH
1.0000 | MEDICATED_PATCH | CUTANEOUS | Status: DC
  Administered 2024-04-23: 1 via TRANSDERMAL
  Filled 2024-04-23: qty 1

## 2024-04-23 MED ORDER — IOHEXOL 350 MG/ML SOLN
75.0000 mL | Freq: Once | INTRAVENOUS | Status: AC | PRN
  Administered 2024-04-23: 75 mL via INTRAVENOUS

## 2024-04-23 NOTE — ED Provider Notes (Signed)
 [place] [facility] Provider Note   CSN: [id number] Arrival date & time: 04/23/24  1228     Patient presents with: Back Pain and Shoulder Pain   Victoria Sampson is a 53 y.o. female.   Patient with a past medical history of elevated BMI, HTN on amlodipine and spironolactone, hypothyroidism, who presents to the emergency department for back and left upper extremity pain radiating to her chest.  Reports that symptoms have been ongoing intermittently for approximately 1 week.  Symptoms are associated with shortness of breath and pleuritic component of chest pain as well as a heaviness to the chest and a achiness in her left back and left upper extremity.  General exertion such as walking does not aggravate symptoms, however repeated lifting such as at work (patient works in [employer], [employer]) does exacerbate her symptoms.  Reports that that given the persistence of symptoms for approximately 1 week she became concerned that it could be related to her heart, therefore came to the emergency department for further evaluation.  Patient denies fever, chills, leg swelling, abdominal pain, tearing or sharp component to pain.  The history is provided by the patient and the spouse.       Prior to Admission medications   Medication Sig Start Date End Date Taking? Authorizing Provider  amLODipine (NORVASC) 10 MG tablet Take 10 mg by mouth daily.    [provider]  Cholecalciferol (VITAMIN D3) 50000 units CAPS Take 1 capsule by mouth once a week. On Sundays 05/05/17   [provider]  EPINEPHrine  (EPIPEN  2-PAK) 0.3 mg/0.3 mL IJ SOAJ injection Inject 0.3 mLs (0.3 mg total) into the muscle as needed (for allergic reaction). 06/05/17   Randol Simmonds, MD  fluticasone (FLONASE) 50 MCG/ACT nasal spray Place 1 spray into both nostrils 2 (two) times daily as needed for allergies.  02/01/17   [provider]  levocetirizine (XYZAL) 5  MG tablet Take 5 mg by mouth every evening.    [provider]  levothyroxine (SYNTHROID) 50 MCG tablet Take 50 mcg by mouth daily before breakfast.    [provider]  spironolactone (ALDACTONE) 25 MG tablet Take 25 mg by mouth 2 (two) times daily.     [provider]    Allergies: Asa [aspirin], Benadryl  [diphenhydramine ], Egg-derived products, Garlic, Lemon oil, Lentil, Macrobid [nitrofurantoin macrocrystal], Peanut-containing drug products, Shrimp [shellfish allergy], and Tylenol [acetaminophen]    Review of Systems as per HPI  Updated Vital Signs BP 112/69   Pulse 67   Temp 98.6 F (37 C)   Resp 15   Ht 5' 6 (1.676 m)   Wt 113.4 kg   LMP 04/19/2024   SpO2 99%   BMI 40.35 kg/m   Physical Exam Vitals and nursing note reviewed.  Constitutional:      General: She is not in acute distress.    Appearance: She is well-developed.  HENT:     Head: Normocephalic and atraumatic.  Eyes:     Conjunctiva/sclera: Conjunctivae normal.  Cardiovascular:     Rate and Rhythm: Normal rate and regular rhythm.     Pulses:          Radial pulses are 2+ on the right side and 2+ on the left side.     Heart sounds: No murmur heard. Pulmonary:     Effort: Pulmonary effort is normal. No respiratory distress.     Breath sounds: Normal breath sounds.  Abdominal:  Palpations: Abdomen is soft.     Tenderness: There is no abdominal tenderness.  Musculoskeletal:        General: Tenderness (Mild tenderness to palpation of left deltoid and left trapezius) present. No swelling.     Cervical back: Neck supple.     Right lower leg: No edema.     Left lower leg: No edema.  Skin:    General: Skin is warm and dry.     Capillary Refill: Capillary refill takes less than 2 seconds.  Neurological:     Mental Status: She is alert.  Psychiatric:        Mood and Affect: Mood normal.     (all labs ordered are listed, but only abnormal results are displayed) Labs Reviewed   CBC - Abnormal; Notable for the following components:      Result Value   RBC 5.13 (*)    HCT 46.7 (*)    All other components within normal limits  D-DIMER, QUANTITATIVE - Abnormal; Notable for the following components:   D-Dimer, Quant 0.63 (*)    All other components within normal limits  BASIC METABOLIC PANEL WITH GFR  HCG, SERUM, QUALITATIVE  TROPONIN I (HIGH SENSITIVITY)  TROPONIN I (HIGH SENSITIVITY)    EKG: EKG Interpretation Date/Time:  Monday April 23 2024 13:45:03 EDT Ventricular Rate:  63 PR Interval:  166 QRS Duration:  88 QT Interval:  390 QTC Calculation: 399 R Axis:   56  Text Interpretation: Normal sinus rhythm Nonspecific T wave abnormality in leads III, V1-V4 Abnormal ECG When compared with ECG of 20-Aug-2021 14:37,T wave changes similar to prior Confirmed by Rogelia Satterfield ([id number]) on 04/23/2024 5:33:27 PM  Radiology: DG Chest 2 View Result Date: 04/23/2024 CLINICAL DATA:  Shoulder pain. Chest pain in upper back. Left arm pain. EXAM: CHEST - 2 VIEW COMPARISON:  11/27/2020 FINDINGS: Small linear density in the left mid lung is stable and likely represents a small scar. Otherwise, the lungs are clear. Heart and mediastinum are within normal limits. Trachea is midline. No pleural effusions. No acute bone abnormality. IMPRESSION: No active cardiopulmonary disease. Electronically Signed   By: Juliene Balder M.D.   On: 04/23/2024 14:56    Procedures   Medications Ordered in the ED  lidocaine  (LIDODERM ) 5 % 1 patch (1 patch Transdermal Patch Applied 04/23/24 1621)            HEART Score: 3                    Medical Decision Making Victoria Sampson is a 53 y.o. female who presents for chest pain as per above.  Physical exam is pertinent for tenderness to palpation of left deltoid and trapezius.   The differential includes but is not limited to ACS, arrhythmia, pericardial tamponade, pericarditis, myocarditis, pneumonia, pneumothorax, esophageal, tear, perforated  abdominal viscous, pulmonary embolism, aortic dissection, costochondritis, musculoskeletal chest wall pain, GERD.    Interventions: Lidocaine  patch  See the EMR for full details regarding lab and imaging results.  Victoria Sampson is awake, alert, and HDS.  Her exam is most notable for muscular tenderness in the left deltoid and trapezius.  Aspirin has not been given, as patient has aspirin allergy.  The ECG reveals no anatomical ischemia representing STEMI, New-Onset Arrhythmia, or ischemic equivalent. She has been risk stratified with a HEAR score of 3. Initial troponin is 3; delta troponin is 4.  The patient's presentation, the patient being hemodynamically stable, and the ECG are not consistent  with Pericardial Tamponade. The patient's pain is not positional. This in conjunction with the lack of PR depressions and ST elevations on the ECG are reassuring against Pericarditis. The patient's non-elevated troponin and ECG are also inconsistent with Myocarditis.  The CXR is unremarkable for focal airspace disease.  The patient is afebrile and denies productive cough.  Therefore, I do not suspect Pneumonia. There is no evidence of Pneumothorax on physical exam or on the CXR. CXR shows no evidence of Esophageal Tear and there is no recent intractable emesis or esophageal instrumentation. There is no peritonitis or free air on CXR worrisome for a Perforated Abdominal Viscous.  Pulmonary Embolism is on the differential. The patient is at risk via the Revised Geneva Criteria. Therefore, we will further risk stratify the patient with a d-dimer.  This was elevated. Therefore, a CTA was ordered and pending at the time of handoff.  The patient's pain is not tearing and it does not radiate to back. Pulses are present bilaterally in both the upper and lower extremities. CXR does not show a widened mediastinum. I have a very low suspicion for Aortic Dissection.  Overall on reevaluation after lidocaine  patch  administration patient with partial improvement symptoms.  Patient requires CTA of the chest to further evaluate for PE given age, pleuritic component of pain, however do not feel that patient requires further workup for other emergent etiologies of chest pain given above reassuring workup.  Discussed with patient that we will obtain CT of the chest, and if reassuring, plan is for discharge with referral to cardiology given patient does have cardiac risk factors, though given patient's reassuring troponins and hear score of 3, feel that patient is appropriate for outpatient workup.  Consults: None  Disposition: Care was handed off to Dr. Darra at the time of signout with plan for following up CT PE study, and likely discharge thereafter with or without anticoagulation pending results of CT.  MDM generated using voice dictation software and may contain dictation errors.  Please contact me for any clarification or with any questions.      Amount and/or Complexity of Data Reviewed Independent Historian: spouse Labs: ordered.    Details: CBC without leukocytosis, anemia, thrombocytopenia.  BMP without AKI marginal trite transient.  Serum hCG negative.  Initial troponin 3, delta of 4.  D-dimer elevated at 0.6 Radiology: ordered and independent interpretation performed.    Details: Chest x-ray without focal airspace opacification, cardiomediastinal silhouette TrueTrack, pneumothorax, pleural effusion, bony derangement ECG/medicine tests: ordered and independent interpretation performed. Decision-making details documented in ED Course.  Risk OTC drugs.    Final diagnoses:  Chest pain, unspecified type    ED Discharge Orders          Ordered    Ambulatory referral to Cardiology       Comments: If you have not heard from the Cardiology office within the next 72 hours please call [phone].   04/23/24 1742               Rogelia Jerilynn RAMAN, MD 04/23/24 1744

## 2024-04-23 NOTE — ED Triage Notes (Signed)
 Patient reports a dull, nagging pain in the left shoulder with intermittent left arm heaviness and chest pain x 1 week. Patient states she also felt exhausted just holding both arms up to do her hair. Patient reports taking advil  at home with no change or relief.

## 2024-04-23 NOTE — ED Notes (Signed)
 CCMD called by this RN

## 2024-04-23 NOTE — ED Notes (Signed)
 Extra red top drawn

## 2024-04-23 NOTE — ED Provider Notes (Signed)
 Blood pressure 131/74, pulse 69, temperature 98.4 F (36.9 C), temperature source Oral, resp. rate 17, height 5' 6 (1.676 m), weight 113.4 kg, last menstrual period 04/19/2024, SpO2 99%.  Assuming care from Dr. Rogelia.  In short, Victoria Sampson is a 53 y.o. female with a chief complaint of Back Pain and Shoulder Pain .  Refer to the original H&P for additional details.  The current plan of care is to follow up on CT PE scan.  07:15 PM  CTA negative for PE.  ACS workup reassuring.  Plan for ambulatory referral to cardiology placed by prior attending.  Stable for discharge.    Darra Fonda MATSU, MD 04/23/24 [phone]

## 2024-04-23 NOTE — ED Provider Triage Note (Signed)
 Emergency Medicine Provider Triage Evaluation Note  Victoria Sampson , a 53 y.o. female  was evaluated in triage.  Pt complains of fatigue. For the past week she notice some pain to upper back and L arm with increase fatigue, occasional nausea, and mild sob.  No active cp, no fever, chills, cough.  No hx of cardiac disease . Had had a stress test several years ago and it was normal.  No hx of PE/DVT.  Hx of gestational DM.    Review of Systems  Positive: As above Negative: As above  Physical Exam  BP 137/75 (BP Location: Right Arm)   Pulse 81   Temp 98.6 F (37 C)   Resp 16   Ht 5' 6 (1.676 m)   Wt 113.4 kg   SpO2 97%   BMI 40.35 kg/m  Gen:   Awake, no distress   Resp:  Normal effort  MSK:   Moves extremities without difficulty  Other:    Medical Decision Making  Medically screening exam initiated at 1:44 PM.  Appropriate orders placed.  Victoria Sampson was informed that the remainder of the evaluation will be completed by another provider, this initial triage assessment does not replace that evaluation, and the importance of remaining in the ED until their evaluation is complete.    Nivia Colon, PA-C 04/23/24 1346

## 2024-04-23 NOTE — Discharge Instructions (Signed)

## 2024-04-23 NOTE — ED Notes (Signed)
Pt ambulated to restroom well 

## 2024-06-13 ENCOUNTER — Emergency Department (HOSPITAL_COMMUNITY)
Admission: EM | Admit: 2024-06-13 | Discharge: 2024-06-13 | Disposition: A | Discharge status: Home / Self Care | Attending: Emergency Medicine | Admitting: Emergency Medicine

## 2024-06-13 ENCOUNTER — Other Ambulatory Visit: Payer: Self-pay

## 2024-06-13 DIAGNOSIS — T783XXA Angioneurotic edema, initial encounter: Secondary | ICD-10-CM | POA: Diagnosis not present

## 2024-06-13 DIAGNOSIS — R600 Localized edema: Secondary | ICD-10-CM | POA: Diagnosis present

## 2024-06-13 DIAGNOSIS — E039 Hypothyroidism, unspecified: Secondary | ICD-10-CM | POA: Insufficient documentation

## 2024-06-13 DIAGNOSIS — Z91010 Allergy to peanuts: Secondary | ICD-10-CM | POA: Insufficient documentation

## 2024-06-13 DIAGNOSIS — Z79899 Other long term (current) drug therapy: Secondary | ICD-10-CM | POA: Insufficient documentation

## 2024-06-13 DIAGNOSIS — I10 Essential (primary) hypertension: Secondary | ICD-10-CM | POA: Insufficient documentation

## 2024-06-13 MED ORDER — FAMOTIDINE 20 MG PO TABS
40.0000 mg | ORAL_TABLET | Freq: Once | ORAL | Status: AC
Start: 2024-06-13 — End: 2024-06-13
  Administered 2024-06-13: 40 mg via ORAL
  Filled 2024-06-13: qty 2

## 2024-06-13 MED ORDER — FAMOTIDINE 20 MG PO TABS: 20.0000 mg | ORAL_TABLET | Freq: Every day | ORAL | 0 refills | Status: AC

## 2024-06-13 MED ORDER — PREDNISONE 20 MG PO TABS
60.0000 mg | ORAL_TABLET | Freq: Once | ORAL | Status: AC
  Administered 2024-06-13: 60 mg via ORAL
  Filled 2024-06-13: qty 3

## 2024-06-13 MED ORDER — PREDNISONE 20 MG PO TABS: 40.0000 mg | ORAL_TABLET | Freq: Every day | ORAL | 0 refills | Status: AC

## 2024-06-13 NOTE — ED Triage Notes (Addendum)
 Pt in with allergic rxn to unknown, has several known allergies - pt states she was at work tonight and began to feel throat itching and mouth swelling at 10pm, ended up having to get a coworker to give her an Epi-pen - injected at 2355. Pt states she also took a Zyrtec PTA. No hives, states throat tightness improved, +notable R lower lip swollen, is having to clear throat often. *allergy to beandryl*

## 2024-06-13 NOTE — ED Notes (Signed)
 Victoria Sampson, [place] assessing in triage

## 2024-06-13 NOTE — ED Provider Triage Note (Signed)
 Emergency Medicine Provider Triage Evaluation Note  Victoria Sampson , a 53 y.o. female  was evaluated in triage.  Pt complains of allergic reaction. Patient drank a beverage with dye in it, thinks might be related to the dye, did not consume any of her known allergens. First noticed her lip felt funny around 10:30pm tonight. Progressed to lip swelling and itching in her throat. Used epipen  just before midnight. Throat discomfort has resolved, lip swelling persists. No vomiting, no cough/wheezing. Hx HTN, not on ACE.  Review of Systems  Positive:  Negative:   Physical Exam  BP 130/73 (BP Location: Right Arm)   Pulse 73   Temp 97.7 F (36.5 C)   Resp 18   Wt 113.4 kg   SpO2 97%   BMI 40.35 kg/m  Gen:   Awake, no distress   Resp:  Normal effort, lungs CTA MSK:   Moves extremities without difficulty  Other:    Medical Decision Making  Medically screening exam initiated at 12:51 AM.  Appropriate orders placed.  Victoria Sampson was informed that the remainder of the evaluation will be completed by another provider, this initial triage assessment does not replace that evaluation, and the importance of remaining in the ED until their evaluation is complete.     Beverley Leita LABOR, PA-C 06/13/24 [phone]

## 2024-06-13 NOTE — ED Provider Notes (Signed)
 [place] EMERGENCY DEPARTMENT AT [facility] Provider Note  CSN: [id number] Arrival date & time: 06/13/24 0040  Chief Complaint(s) Allergic Reaction  HPI Victoria Sampson is a 53 y.o. female    The history is provided by the patient.  Allergic Reaction Presenting symptoms: swelling   Presenting symptoms: no difficulty breathing, no difficulty swallowing, no itching, no rash and no wheezing   Severity:  Moderate Duration:  2 hours Relieved by:  Epinephrine  (administered PTA)   Past Medical History Past Medical History:  Diagnosis Date   Allergic rhinitis    Allergy    Food allergy    Hypertension    Hypothyroidism    Obesity    There are no active problems to display for this patient.  Home Medication(s) Prior to Admission medications   Medication Sig Start Date End Date Taking? Authorizing Provider  famotidine  (PEPCID ) 20 MG tablet Take 1 tablet (20 mg total) by mouth daily for 5 days. 06/13/24 06/18/24 Yes Bulmaro Feagans, Raynell Moder, MD  predniSONE  (DELTASONE ) 20 MG tablet Take 2 tablets (40 mg total) by mouth daily with breakfast for 4 days. 06/13/24 06/17/24 Yes Shaletha Humble, Raynell Moder, MD  amLODipine (NORVASC) 10 MG tablet Take 10 mg by mouth daily.    [provider]  Cholecalciferol (VITAMIN D3) 50000 units CAPS Take 1 capsule by mouth once a week. On Sundays 05/05/17   [provider]  EPINEPHrine  (EPIPEN  2-PAK) 0.3 mg/0.3 mL IJ SOAJ injection Inject 0.3 mLs (0.3 mg total) into the muscle as needed (for allergic reaction). 06/05/17   Randol Simmonds, MD  fluticasone (FLONASE) 50 MCG/ACT nasal spray Place 1 spray into both nostrils 2 (two) times daily as needed for allergies.  02/01/17   [provider]  levocetirizine (XYZAL) 5 MG tablet Take 5 mg by mouth every evening.    [provider]  levothyroxine (SYNTHROID) 50 MCG tablet Take 50 mcg by mouth daily before breakfast.    [provider]  spironolactone (ALDACTONE) 25 MG  tablet Take 25 mg by mouth 2 (two) times daily.     [provider]                                                                                                                                    Allergies Asa [aspirin], Benadryl  [diphenhydramine ], Egg protein-containing drug products, Garlic, Lemon oil, Lentil, Macrobid [nitrofurantoin macrocrystal], Peanut-containing drug products, Shrimp [shellfish allergy], and Tylenol [acetaminophen]  Review of Systems Review of Systems  HENT:  Negative for trouble swallowing.   Respiratory:  Negative for wheezing.   Skin:  Negative for itching and rash.   As noted in HPI  Physical Exam Vital Signs  I have reviewed the triage vital signs BP 130/73 (BP Location: Right Arm)   Pulse 73   Temp 97.7 F (36.5 C)   Resp 18   Wt 113.4 kg   SpO2 97%   BMI  40.35 kg/m   Physical Exam Vitals reviewed.  Constitutional:      General: She is not in acute distress.    Appearance: She is well-developed. She is not diaphoretic.  HENT:     Head: Normocephalic and atraumatic.     Nose: Nose normal.     Mouth/Throat:     Mouth: Angioedema present.     Pharynx: Oropharynx is clear. No uvula swelling.     Comments: Right lower lip swelling Eyes:     General: No scleral icterus.       Right eye: No discharge.        Left eye: No discharge.     Conjunctiva/sclera: Conjunctivae normal.     Pupils: Pupils are equal, round, and reactive to light.  Cardiovascular:     Rate and Rhythm: Normal rate and regular rhythm.     Heart sounds: No murmur heard.    No friction rub. No gallop.  Pulmonary:     Effort: Pulmonary effort is normal. No respiratory distress.     Breath sounds: Normal breath sounds. No stridor. No rales.  Abdominal:     General: There is no distension.     Palpations: Abdomen is soft.     Tenderness: There is no abdominal tenderness.  Musculoskeletal:        General: No tenderness.     Cervical back: Normal range of motion  and neck supple.  Skin:    General: Skin is warm and dry.     Findings: No erythema or rash.  Neurological:     Mental Status: She is alert and oriented to person, place, and time.     ED Results and Treatments Labs (all labs ordered are listed, but only abnormal results are displayed) Labs Reviewed - No data to display                                                                                                                       EKG  EKG Interpretation Date/Time:    Ventricular Rate:    PR Interval:    QRS Duration:    QT Interval:    QTC Calculation:   R Axis:      Text Interpretation:         Radiology No results found.  Medications Ordered in ED Medications  famotidine  (PEPCID ) tablet 40 mg (40 mg Oral Given 06/13/24 0057)  predniSONE  (DELTASONE ) tablet 60 mg (60 mg Oral Given 06/13/24 0057)   Procedures Procedures  (including critical care time) Medical Decision Making / ED Course   Medical Decision Making   53 y.o. female here with lip swelling.  Patient already received epinephrine  x 1 prior to arrival.  Patient is unsure of trigger.  Reports feeling much better after getting the EpiPen .  On exam, patient has right lower lip swelling.  No intraoral angioedema.  No wheezing.  No respiratory distress.  No rash.  Given H2 blocker, and steroids.    Clinical Course as  of 06/13/24 0359  Wed Jun 13, 2024  0329 Improving  [PC]    Clinical Course User Index [PC] Suheyla Mortellaro, Raynell Moder, MD   Monitored for 4 hrs. Symptoms with continued improvement Safe for discharge with strict return precautions. Given Rx for H2 blocker and steroids.  Final Clinical Impression(s) / ED Diagnoses Final diagnoses:  Angioedema, initial encounter   The patient appears reasonably screened and/or stabilized for discharge and I doubt any other medical condition or other [facility] requiring further screening, evaluation, or treatment in the ED at this time. I have discussed the  findings, Dx and Tx plan with the patient/family who expressed understanding and agree(s) with the plan. Discharge instructions discussed at length. The patient/family was given strict return precautions who verbalized understanding of the instructions. No further questions at time of discharge.  Disposition: Discharge  Condition: Good  ED Discharge Orders          Ordered    famotidine  (PEPCID ) 20 MG tablet  Daily        06/13/24 0359    predniSONE  (DELTASONE ) 20 MG tablet  Daily with breakfast        06/13/24 0359             Follow Up: Addie Camellia CROME, MD [address] [place] [address] [phone]  Call  to schedule an appointment for close follow up    This chart was dictated using voice recognition software.  Despite best efforts tHistoryo proofread,  errors can occur which can change the documentation meaning.    Trine Raynell Moder, MD 06/13/24 [phone]
